# Patient Record
Sex: Female | Born: 1980 | Race: Black or African American | Hispanic: No | Smoking: Never smoker
Health system: Southern US, Community
[De-identification: ages and names within clinical notes are randomized; demographics above are authoritative.]

## PROBLEM LIST (undated history)

## (undated) ENCOUNTER — Inpatient Hospital Stay (HOSPITAL_COMMUNITY): Payer: Self-pay

## (undated) DIAGNOSIS — G971 Other reaction to spinal and lumbar puncture: Secondary | ICD-10-CM

## (undated) DIAGNOSIS — M67439 Ganglion, unspecified wrist: Secondary | ICD-10-CM

## (undated) DIAGNOSIS — Z9889 Other specified postprocedural states: Secondary | ICD-10-CM

## (undated) DIAGNOSIS — B009 Herpesviral infection, unspecified: Secondary | ICD-10-CM

## (undated) DIAGNOSIS — E119 Type 2 diabetes mellitus without complications: Secondary | ICD-10-CM

## (undated) DIAGNOSIS — D649 Anemia, unspecified: Secondary | ICD-10-CM

## (undated) DIAGNOSIS — J302 Other seasonal allergic rhinitis: Secondary | ICD-10-CM

## (undated) DIAGNOSIS — T7840XA Allergy, unspecified, initial encounter: Secondary | ICD-10-CM

## (undated) DIAGNOSIS — R112 Nausea with vomiting, unspecified: Secondary | ICD-10-CM

## (undated) HISTORY — DX: Type 2 diabetes mellitus without complications: E11.9

## (undated) HISTORY — DX: Allergy, unspecified, initial encounter: T78.40XA

## (undated) HISTORY — DX: Anemia, unspecified: D64.9

---

## 2001-02-02 ENCOUNTER — Encounter (INDEPENDENT_AMBULATORY_CARE_PROVIDER_SITE_OTHER): Payer: Self-pay | Admitting: Specialist

## 2001-02-02 ENCOUNTER — Other Ambulatory Visit: Admission: RE | Admit: 2001-02-02 | Discharge: 2001-02-02 | Payer: Self-pay | Admitting: Obstetrics

## 2002-04-29 ENCOUNTER — Emergency Department (HOSPITAL_COMMUNITY): Admission: EM | Admit: 2002-04-29 | Discharge: 2002-04-29 | Payer: Self-pay | Admitting: Emergency Medicine

## 2003-07-24 ENCOUNTER — Emergency Department (HOSPITAL_COMMUNITY): Admission: EM | Admit: 2003-07-24 | Discharge: 2003-07-24 | Payer: Self-pay | Admitting: Emergency Medicine

## 2003-11-17 ENCOUNTER — Other Ambulatory Visit: Admission: RE | Admit: 2003-11-17 | Discharge: 2003-11-17 | Payer: Self-pay | Admitting: Obstetrics and Gynecology

## 2004-05-02 ENCOUNTER — Inpatient Hospital Stay (HOSPITAL_COMMUNITY): Admission: AD | Admit: 2004-05-02 | Discharge: 2004-05-02 | Payer: Self-pay | Admitting: Obstetrics and Gynecology

## 2004-05-05 ENCOUNTER — Inpatient Hospital Stay (HOSPITAL_COMMUNITY): Admission: AD | Admit: 2004-05-05 | Discharge: 2004-05-08 | Payer: Self-pay | Admitting: Obstetrics and Gynecology

## 2005-01-25 ENCOUNTER — Other Ambulatory Visit: Admission: RE | Admit: 2005-01-25 | Discharge: 2005-01-25 | Payer: Self-pay | Admitting: Obstetrics and Gynecology

## 2005-01-31 ENCOUNTER — Ambulatory Visit (HOSPITAL_COMMUNITY): Admission: RE | Admit: 2005-01-31 | Discharge: 2005-01-31 | Payer: Self-pay | Admitting: Obstetrics and Gynecology

## 2005-05-14 ENCOUNTER — Inpatient Hospital Stay (HOSPITAL_COMMUNITY): Admission: AD | Admit: 2005-05-14 | Discharge: 2005-05-17 | Payer: Self-pay | Admitting: Obstetrics and Gynecology

## 2005-05-14 ENCOUNTER — Ambulatory Visit: Payer: Self-pay | Admitting: Family Medicine

## 2005-05-14 ENCOUNTER — Encounter (INDEPENDENT_AMBULATORY_CARE_PROVIDER_SITE_OTHER): Payer: Self-pay | Admitting: Specialist

## 2007-11-19 ENCOUNTER — Inpatient Hospital Stay (HOSPITAL_COMMUNITY): Admission: AD | Admit: 2007-11-19 | Discharge: 2007-11-22 | Payer: Self-pay | Admitting: Obstetrics and Gynecology

## 2010-10-03 ENCOUNTER — Inpatient Hospital Stay: Attending: Maternal & Fetal Medicine

## 2010-10-03 LAB — OFFICE VISIT LAB RESULTS
ABORh: A POS
Free T4: 0.86 ng/dL
Glucose Challenge, 2 Hr Preg: 224 mg/dL
Glucose Challenge, 3 Hr Preg: 206 mg/dL
Glucose, GTT 1HR Preg: 170 mg/dL
Glucose, GTT 1HR Preg: 193 mg/dL
Protein, 24H Urine: <5 mg/24hr
Rubella Antibody: IMMUNE intl units/mL
Varicella IgG: NEGATIVE

## 2010-10-03 NOTE — Unmapped (Signed)
Signed by Leland Johns MD on 10/03/2010 at 00:00:00  OB PRE ADMISSION FORM       Imported By: Milinda Hirschfeld 10/03/2010 10:38:47    _____________________________________________________________________    External Attachment:    Please see [facility] EMR for this document.
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
## 2010-10-03 NOTE — Unmapped (Signed)
Signed by Clinton Quant MS ACNS RNC OB on 10/03/2010 at 16:37:41    Printed Handout:  - 24 Hour Urine Collection Patient Instruction Sheet

## 2010-10-03 NOTE — Unmapped (Signed)
Signed by Clinton Quant MS ACNS RNC OB on 11/02/2010 at 11:05:03        Allergies  ! * PCN    Vital Signs   Weight: 188 lbs.   Pre-Pregnancy Weight: 179 lbs.                 Menses History:   [facility]ospital of Delivery: [facility]  First day LMP: 07/06/2010  This date is: definite  Menses monthly: yes  Frequency:  28 days  Contraception used at conception: None  Planned pregnancy: no  Pregnancy confirmed by: serum HCG  Date confirmed: 10/07/2010  G: 3  P 1:     Term: 1   # Living Children: 1  SAB: 1    Other Pertinent History:   Pre-Pregnacy Tobacco Use per Day: 0  Pregnancy Tobacco Use per Day: 0  Pre-Pregnacy Alcohol Use per Day: 0  Pregnancy Alcohol Use per Day: 0  Pre-Pregnancy Illicit/Recreational Drug Use per Day: 0  Pregnancy Illicit/Recreational Drug Use per Day: 0  D(Rh) Sensitized: No  Latex Allergy: No  Betadine Allergy: No  Anesthetic Complications No  History of Abnormal Pap: No  Uterine Anomaly: No  History of Infertility: No  DES Exposure: No  ART Treatment: No      Infection History:   Exposed to TB: no   History of Genital Herpes: yes - partner has genital herpes  Rash or Viral Illness since LMP: no  Hepatitis B: no  Hepatitis C: no  History of STD: no  History of PID: no  Other Infection History Comments: at risk for HIv having unprotected sex , had trich at the age of 67 but was treated       Genetic Testing/Teratology Counseling:   Patient's Age>= 35 Years as of Estimated Date of Delivery: no  Thalassemia Risk: no  Neural Tube Defect: no  Congenital Heart Defect: no   Down Syndrome: no  Tay-Sachs: no  Canavan Disease: no  Familial Dysautonomia: no  Sickle Cell Disease/Trait: no  Hemophilia/Blood Disorders: no  Muscular Dystrophy: no  Cystic Fibrosis: no  Huntington's Chorea: no  Mental Retardation/Autism no  Tested for Fragile X: no  Other Genetic or Chromosomal: no  Maternal Metabolic Disorder: no  Other birth defects: no  Recurrent Pregnancy Loss/Stillbirth: no  Medications/Drugs/Alcohol  since LMP: no    EDD:   Pregnancy Dating Information:    LMP: 07/06/2010    EDD by LMP: 04/12/2011  1st [place] date: 02/08/2012EDD: 04/12/2011  Today's EGA in weeks: 12 5/7    History of Present Illness:   pt reports failing 3 hr OGTT - 2 values  pt reports nausea 5-7 days  pt reports lifting boxes at work resulting in back pain - no meds  pt reports fatigue      Symptoms:   Complains of: fatigue, nausea    Diabetes History:   Age at Diagnosis: 26  Type: DM Type 2  DM Class: B - Onset at >30 years of age or duration of < 10 years  Other Medications: took Glyburide during pregnancy  took Metformin after delivery of first child  Other History: hx of GDM with last pregnancy 05/06/06  SAB in 03/2008 at 8 wks    Diabetes Treatment:     Glycemic Control:    random fsbg per pt glucose meter after instruction - 107 mg/dL    Compliance:   Dietary Compliance: Fair  Emotional Health: Stable   MNT with Angelique Holm, MS, RDLD/CDE today  10/03/10    Prenatal Exam:     Weight    Pre-Pregnancy Weight: 179 lbs.  Total weight change: 9 lbs.  Weight change since last visit: 188 lbs.    Exam   Est. Gestational Age: 31 5/7    List of Diagnoses/Issues:   T2DM - diet controlled 3-4 years; stopped Metformin after 4 mos d/t low bgs  hx of GDM - took Glyburide  hx of C-Section  estranged from FOB  nausea    Plan of Care:   U/S today 10/03/10   Diabetes self management as prescribed per HCPs  F/U visit with MD 10/09/10; provide bg record and glucose meter  Collect 24 hour urine and have diabetes labs drawn on 10/08/10   Social worker at next visit        Initial Labs:   Blood Type: a positive        Assessment   30 Years Old Female      G: 3  P 1:     Term: 1   # Living Children: 1  SAB: 1  GA: 12 5/7 weeks       EDD: 04/12/2011    New Problems:   DIABETES MELLITUS, TYPE II, CONTROLLED (ICD-250.00)  DIABETES MELLITUS, GESTATIONAL, HX OF (ICD-V13.29)  PREGNANCY, HIGH RISK (ICD-V23.9)  OVERWEIGHT (ICD-278.02)  DIABETES MELLITUS, TYPE II  (ICD-250.00)      Today's Orders   Hemoglobin  A1C   (GLYCO) (496)  [CPT-83036]  24HR Urine Creatinine Clearance (7943) [CPT-82575]  24HR Urine Protein, Total w/Creatinine (757) [CPT-84156,82570]  Comp Metabolic Panel  (METAPNL) (10231) [*CPT-80053]  TSH   (TSH) (899) [CPT-84443]  T4 free  (FT4) (866)  [CPT-84439]  TODAY'S REFERRALS   Nutrition Consult [UCP-11111]    Patient Instructions   Return to office 5 days to turn in 24 hour urine 8AM.  Have labs drawn.  Return to office to see MD for diabetes care.  Bring glucose meter and blood glucose record.  Report blood sugars to Clinton Quant if blood glucoses elevated for 3 or more days at [phone].    Disposition:   Clinic: DAPP  Primary OB Provider: any  Return to clinic for Provider Visit in 6  day(s)   Appointment Reason: evaluate diabetes self management    *Patient Care Summary printed and given to patient.        Spent 60 min with pt counseling about diabetes self management during pregnancy.  Referred to RD for MNT.  Pt agrees to POC and RTO for DAPP visit w/MD.  Will follow as needed. ..................................................................Marland KitchenClinton Quant MS ACNS RNC [facility]  October 03, 2010 4:22 PM          DAPP History & Education   Initial Education    Diabetes History:   Age at Diagnosis: 41  Type: DM Type 2  DM Class: B - Onset at >1 years of age or duration of < 10 years                   Date:   10/08/2010  Free T4:   0.86 ng/dL  24 HR Protein: < 5 mg/dL        Diabetes and Pregnancy Program (DAPP)   Initial Education  GA: 12.5  Learning Assessment  Adequate reading skill: Yes   Refer to dietitian ..................................................................Marland KitchenClinton Quant MS ACNS RNC OB  Oct 03, 2010 4:01 PM  Being Active  Current activity level: usually active (30 mins 5-7 days/week)  Activity goal  set by client: 40 minutes walking/per day back and forth to work   ..................................................................Marland KitchenClinton Quant MS ACNS RNC OB  Oct 03, 2010 4:01 PM    Blood Glucose Monitoring   Verbalizes blood glucose targets for pregnancy   Demonstrates how to record glucose values in daily diary   Washes hands (no alcohol wipes) before fingerstick   Verbalizes safe use and disposal of lancets   Instructed to bring blood sugar record and glucose meter to every OB visit  Check blood glucose:   Fasting;  1 hr postmeal;  Premeal;  At bedtime   Begin monitoring bgs 7 x daily. ..................................................................Marland KitchenClinton Quant MS ACNS RNC OB  Oct 03, 2010 4:01PM    Reducing Risks   Verbalizes the type of diabetes   Verbalizes effects of diabetes on pregnancy   Verbalizes effects of pregnancy on diabetes (Type 1 & 2)   Insulin needs during pregnancy (handout provided)   Verbalizes effects of diabetes during pregnancy on fetus & newborn   Verbalizes the benefits of breastfeeding   Verbalizes understanding of future health risks and prevention   Agrees to perform daily kick counts twice daily (beginning @ 28 weeks)   Understands potential maternal/fetal complications   Understands importance of maternal/fetal testing   Carries glucose meter and fast acting CHO when away from home   ..................................................................Marland KitchenClinton Quant MS ACNS RNC OB  Oct 03, 2010 4:01 PM   ..................................................................Marland KitchenClinton Quant MS ACNS RNC OB  Oct 03, 2010 4:01 PM  Problem-Solving   Verbalizes who to call (resource numbers)   ..................................................................Marland KitchenClinton Quant MS ACNS RNC OB  Oct 03, 2010 4:01 PM

## 2010-10-03 NOTE — Unmapped (Signed)
Signed by Leland Johns MD on 10/03/2010 at 00:00:00  labs 10/01/10      Imported By: Milinda Hirschfeld 10/03/2010 11:00:52    _____________________________________________________________________    External Attachment:    Please see [facility] EMR for this document.

## 2010-10-03 NOTE — Unmapped (Signed)
Signed by Wallis Mart MS RD LD CDE on 10/04/2010 at 12:09:43        DAPP History & Education   Pregnancy Counseling Initial Education    Diabetes History:   DM Class: GDM vs Class B- dx < 20 weeks  Referring Physician: Jodelle Green CNM  Referal Souce: Shella Spearing  Previous Diabetes Education: yes  Where [facility]    Diabetes Education: Learning Assessment  Adequate reading skill: Yes  Barriers to Learning: Financial  Pt became tearful when discussing support system and financial concerns.  Pt was married last pregnancy with diabetes and she is stressed about handling it alone this time.  Emotional support provided and will refer to social worker. ..................................................................Marland KitchenWallis Mart MS RD LD CDE  October 03, 2010 4:38 PM    Pt states she was dx with type 2 DM after last pregnancy in 2009 and was on metformin.  Pt was going too low and she stopped the medication.  Since then pt has not had her BG level checked until now at [redacted] weeks gestation. ..................................................................Marland KitchenWallis Mart MS RD LD CDE  October 03, 2010 4:39 PM    Healthy Eating  My Daily Meal Plan individualized and reviewed with client  States the rationale for meal pattern/snacks  Demonstrates how to count carbs  Demonstrates how to read food labels correctly  Calorie level: 1800   Pt was very receptive to education and remembers a lot of information from previous pregnancy.  Pt very motivated and seems likely to follow program recs.  Pt does not drink regular juice or soda and incorporates very little added sugar into meal plan. Pt recieves WIC and denies eating any non-food substances.  Pts biggest obstacle will be ensuring she eats all her scheduled snacks and meals. Pt tends to not eat if daughter not there due to financial reasons. 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 Instructed pt to speak with HCP about maximum amount she can safely lift. ..................................................................Marland KitchenWallis Mart MS RD LD CDE  October 04, 2010 12:07 PM    Self Monitoring of Blood Glucose (SMBG)   Verbalizes blood glucose targets for pregnancy   Demonstrates how to record glucose values in daily diary   Washes hands (no alcohol wipes) before fingerstick   Verbalizes safe use and disposal of lancets   Instructed to bring blood sugar record and glucose meter to every OB visit   Performs finger stick adequately  Return demo SMBG value= 107  Check blood glucose:   Fasting;  1 hr postmeal;  Premeal;  ACHS Snack  Name of meter: Freestyle  Healthy Coping  Adequate support systems: no  Who is your support system: Sister but about to have own child  Describes stress level as: medium  Referred to Social Services: yes      Time Spent With Patient:   Time spent with patient: 1 hour(s)    PERSONAL MEAL PLAN GOAL   Date: 10/03/2010    Breakfast:   Time: 7-8am  # Carbs: 30    Mid-Morning Snack:   Time: 10am  # Carbs: 15    Lunch:   Time: 12-1pm  # Carbs: 45    Mid-Afternoon Snack:   Time: 3pm  # Carbs: 15  Dinner:   Time: 5-6pm  # Carbs: 45    Evening Snack:   Time: 9-10pm  # Carbs: 30  Comments: High fiber carbs as above with protein source ..................................................................Marland KitchenWallis Mart MS RD LD CDE  October 04, 2010 12:04 PM        Assessment and Plan  New Problems:  Dx of GESTATIONAL DIABETES (ICD-648.80)  Onset: 10/03/2010    Medications   New Prescriptions/Refills:  FREESTYLE FREEDOM LITE W/DEVICE KIT (BLOOD GLUCOSE MONITORING SUPPL) Use as directed  #1 x none, 10/04/2010, Wallis Mart MS RD LD CDE    New medications:  FREESTYLE FREEDOM LITE W/DEVICE KIT -- Use as directed  Start date:  10/03/2010      Today's Orders   G0108 - Diabetes Education Individual (30 mins) [[id number]]

## 2010-10-03 NOTE — Unmapped (Signed)
Signed by   LinkLogic on 10/04/2010 at 11:17:05  Patient: Ashlee Garcia  Note: All result statuses are Final unless otherwise noted.    Tests: (1)  (MR)    Order Note:                                      THE [facility]                                [address]                              [place], [place]  [address]     PATIENT NAME:   Ashlee Garcia, Ashlee Garcia                 VISIT DATE:  10/03/2010  DATE OF BIRTH:  04/28/1981                        MRN:  [id number]  PRIMARY:        Referring Nonstaff                ACCOUNT #:  [id number]  REFERRING:      Referring Nonstaff  DICTATED BY:    Ashlee Garcia, N.P.                                          CLINIC NOTE     *-*-*     Dear Ashlee Garcia, Certified Nurse Midwife.     Thank you for referring your patient to the [facility].  Ashlee Garcia was seen today for an early screening for Down syndrome.  Unfortunately, 80% of undiagnosed chromosome abnormalities are born to women  under the age of 30, though historically we have been told that the risk is  not significant until she reaches this age.  ACOG now recommends that women  have access to diagnostic testing at all ages, but most women do not want to  risk their pregnancy.  Early screening is 95% accurate in detection and does  not have any pregnancy risks.     Ashlee Garcia is a G3, P1.  She was seen at 12-5/7 weeks with an EDD of 04/12/11.  She is 30 years old with a date of birth of 21-Feb-1981.  Her age related risk  for trisomy 57 is 1:593.  Her age related risk for trisomy 49 is 1:1641 and  her age related risk for trisomy 52 is 1:5168.  I reviewed her pedigree tree  and her past medical history with no additional risks noted.     Your patient was informed that the nuchal translucency is a screening tool  and not diagnostic.  The only way to know 100% if her baby has any  chromosomal abnormalities is with an amniocentesis.  The  nuchal translucency  does not equate abnormalities, but only prompts [place] to complete further  testing or for closer surveillance of any complications that may occur later  in pregnancy such as preterm labor, IUGR,  IUFD or placental insufficiency.  We do utilize contingent screening and depending on the results of the  testing, we will adjust the plan of care for individualized care of your  patient.  This screening minimizes amniocentesis.  All of this screening was  explained to the patient.  All questions were answered.  Recommendations are  discussed with all patients after the results are received.  Analytes were  obtained and ultrasound completed at today's visit.  All follow-up care and  further orders do need to be completed by yourself, the primary provider.     More than 50% of this 15 minute consult time was spent face to face.     Thank you for allowing [place] to care for your patient.        *-*-*                                              _______________________________________  EG/nc                                  _____  D:  10/03/2010 11:53                   Ashlee Garcia, N.P.  T:  10/04/2010 11:14  Job #:  [id number]     c:   Ashlee Garcia                                       CLINIC NOTE                                                               PAGE    1 of   1    Note: An exclamation mark (!) indicates a result that was not dispersed into   the flowsheet.  Document Creation Date: 10/04/2010 11:17 AM  _______________________________________________________________________    (1) Order result status: Final  Collection or observation date-time: 10/03/2010 00:00  Requested date-time:   Receipt date-time:   Reported date-time:   Referring Physician: Referring Nonstaff  Ordering Physician:  Reviewed [facility] [place] [facility])  Specimen Source:   Source: DBS  Filler Order Number: [id number]  Lab site:

## 2010-10-03 NOTE — Unmapped (Signed)
Signed by Ranae Palms NP on 10/03/2010 at 11:35:09    NT completed. Analytes received/sent and ultrasound completed. Referring provider is S. Deimling at [facility]. Best number to call results to is: [phone]. See dictation.  ..................................................................Marland KitchenRanae Palms NP  October 03, 2010 11:33 AM     Assessment   30 Years Old Female      G: 3   # Living Children: 1  SAB: 1  New Problems:   PREGNANCY, MULTIGRAVIDA (ICD-V22.1)

## 2010-10-03 NOTE — Unmapped (Signed)
Signed by Leland Johns MD on 10/03/2010 at 00:00:00  PROGRESS NOTES      Imported By: Milinda Hirschfeld 10/03/2010 10:41:16    _____________________________________________________________________    External Attachment:    Please see [employer] EMR for this document.

## 2010-10-03 NOTE — Unmapped (Signed)
Signed by Leland Johns MD on 10/03/2010 at 00:00:00  PROGRESS NOTES      Imported By: Milinda Hirschfeld 10/03/2010 10:40:50    _____________________________________________________________________    External Attachment:    Please see [employer] EMR for this document.

## 2010-10-04 NOTE — Unmapped (Signed)
THE [facility]                                 [address]                               [place], [place]  [address]     PATIENT NAME:   Ashlee Garcia, Ashlee Garcia                 VISIT DATE:  10/03/2010   DATE OF BIRTH:  11/07/1980                        MRN:  [id number]   PRIMARY:        Referring Nonstaff                ACCOUNT #:  [id number]   REFERRING:      Referring Nonstaff   DICTATED BY:    Tacey Ruiz, N.P.                                         CLINIC NOTE     *-*-*     Dear Epifania Gore, Certified Nurse Midwife.     Thank you for referring your patient to the [facility].  Tynlee Bayle was seen today for an early screening for Down syndrome.   Unfortunately, 80% of undiagnosed chromosome abnormalities are born to women   under the age of 52, though historically we have been told that the risk is   not significant until she reaches this age.  ACOG now recommends that women   have access to diagnostic testing at all ages, but most women do not want to   risk their pregnancy.  Early screening is 95% accurate in detection and does   not have any pregnancy risks.     [name] is a G3, P1.  She was seen at 12-5/7 weeks with an EDD of 04/12/11.   She is 30 years old with a date of birth of 07/12/1981.  Her age related risk   for trisomy 36 is 1:593.  Her age related risk for trisomy 81 is 1:1641 and   her age related risk for trisomy 36 is 1:5168.  I reviewed her pedigree tree   and her past medical history with no additional risks noted.     Your patient was informed that the nuchal translucency is a screening tool   and not diagnostic.  The only way to know 100% if her baby has any   chromosomal abnormalities is with an amniocentesis.  The nuchal translucency   does not equate abnormalities, but only prompts [place] to complete further   testing or for closer surveillance of any complications that may  occur later   in pregnancy such as preterm labor, IUGR, IUFD or placental insufficiency.   We do utilize contingent screening and depending on the results of the   testing, we will adjust the plan of care for individualized care of your   patient.  This screening minimizes amniocentesis.  All of this screening was   explained  to the patient.  All questions were answered.  Recommendations are   discussed with all patients after the results are received.  Analytes were   obtained and ultrasound completed at today's visit.  All follow-up care and   further orders do need to be completed by yourself, the primary provider.     More than 50% of this 15 minute consult time was spent face to face.     Thank you for allowing [place] to care for your patient.       *-*-*                                             _______________________________________   EG/nc                                  _____   D:  10/03/2010 11:53                   Tacey Ruiz, N.P.   T:  10/04/2010 11:14   Job #:  [id number]     c:   Epifania Gore                                       CLINIC NOTE                                                                PAGE    1 of   1   T:  10/04/2010 11:14   Job #:  [id number]     c:   Epifania Gore                                       CLINIC NOTE                                                                PAGE    1 of   1

## 2010-10-04 NOTE — Unmapped (Signed)
Signed by Wallis Mart MS RD LD CDE on 10/11/2010 at 09:53:01    SOCIAL WORKER REFERRAL      - Pt needs assitance with baby supplies, resources, emotional support as she is divorced and trying to work with high risk pregnancy.  She has a daughter who lives with her 4 days per week.

## 2010-10-08 ENCOUNTER — Inpatient Hospital Stay

## 2010-10-08 LAB — COMPREHENSIVE METABOLIC PANEL
ALT: 19 units/L (ref 6–40)
AST: 22 units/L (ref 14–36)
Albumin: 3.8 g/dL (ref 3.6–5.1)
Alkaline Phosphatase: 44 units/L (ref 33–115)
Anion Gap: 8 (ref 3–16)
BUN: 7 mg/dL (ref 7–25)
CO2: 21 mmol/L (ref 21–33)
Calcium: 9 mg/dL (ref 8.6–10.2)
Chloride: 109 mmol/L (ref 98–110)
Creatinine: 0.52 mg/dL (ref 0.50–1.20)
GFR MDRD Non Af Amer: 139 mL/min
Potassium: 3.7 mmol/L (ref 3.5–5.3)
Sodium: 138 mmol/L (ref 135–146)
Total Bilirubin: 0.2 mg/dL (ref 0.2–1.2)
Total Protein: 6.9 g/dL (ref 6.2–8.3)

## 2010-10-08 LAB — PROTEIN, URINE, 24 HOUR
Creatinine, 24 Hour Urine: 1.36 g/(24.h) (ref 0.80–1.80)
Creatinine, Random Urine: 115.6 mg/dL

## 2010-10-08 LAB — CREATININE CLEARANCE (24 HOUR URINE)
Creatinine Clearance: 165 mL/min (ref 88–128)
Creatinine, 24 Hour Urine: 1281.9 mg/24hr (ref 800.0–1800.0)
Creatinine, Random Urine: 109.1 mg/dL (ref 16.0–327.0)
Creatinine: 0.54 mg/dL (ref 0.57–1.00)
EGFR: 147 mL/min/{1.73_m2} (ref >59)

## 2010-10-08 LAB — TSH: TSH: 1.35 microintl units/mL (ref 0.45–4.50)

## 2010-10-08 LAB — CREATININE, URINE, 24 HOUR
Creatinine, 24 Hour Urine: 1.36 g/(24.h) (ref 0.80–1.80)
Creatinine, Random Urine: 115.6 mg/dL

## 2010-10-08 LAB — HEMOGLOBIN A1C: Hemoglobin A1C: 5.6 % (ref 4.8–6.4)

## 2010-10-08 NOTE — Unmapped (Signed)
Signed by Clinton Quant MS ACNS RNC OB on 10/08/2010 at 15:28:52  Patient: Ashlee Garcia  Note: All result statuses are Final unless otherwise noted.    Tests: (1) TIMED URINE PROTEIN (24TP)    Creatinine, Ur            115.6 mg/dL  ! T. Protein, Ur            <5 mg/dL   Creatinine, 24 Hour Urine                              1.36 g/24 hr                0.80-1.80  ! Volume (ml)               1175 mL   Protein, 24 hour calc.                              See Note mg/24 hr           30.0-150.0      Unable to calculate result either because contributing result(s) are   outside of reportable range or are not available.    Note: An exclamation mark (!) indicates a result that was not dispersed into   the flowsheet.  Document Creation Date: 10/08/2010 9:42 AM  _______________________________________________________________________    (1) Order result status: Final  Collection or observation date-time: 10/08/2010 08:25  Requested date-time: 10/08/2010 08:25  Receipt date-time: 10/08/2010 08:51  Reported date-time: 10/08/2010 09:42  Referring Physician: Cindi Carbon NONSTAFF  Ordering Physician: Clinton Quant Hartley Barefoot)  Specimen Source: UD&TIMED URINE     HLA RMTMP&YEL PLAIN (Rm Tmp)  Source: Faith Rogue Order Number: [id number]  Lab site: [facility]      [address]      [place] [address]  [phone]      -----------------    The following non-numeric lab results were dispersed to  the flowsheet even though numeric results were expected:      Protein, 24 hour calc., See Note

## 2010-10-08 NOTE — Unmapped (Signed)
Signed by Clinton Quant MS ACNS RNC OB on 10/08/2010 at 15:28:52  Patient: Ashlee Garcia  Note: All result statuses are Final unless otherwise noted.    Tests: (1) FREE T4 (FT4)  ! Free T4                   0.86 ng/dL                  0.61-1.76    Note: An exclamation mark (!) indicates a result that was not dispersed into   the flowsheet.  Document Creation Date: 10/08/2010 9:50 AM  _______________________________________________________________________    (1) Order result status: Final  Collection or observation date-time: 10/08/2010 08:25  Requested date-time: 10/08/2010 08:25  Receipt date-time: 10/08/2010 08:56  Reported date-time: 10/08/2010 09:50  Referring Physician: Cindi Carbon NONSTAFF  Ordering Physician: Clinton Quant Hartley Barefoot)  Specimen Source: S&SERUM     SST ROOM T&SST (serum Rm Tmp)  Source: Faith Rogue Order Number: [id number]  Lab site: [facility]      [address]      [place] [address]  [phone]

## 2010-10-08 NOTE — Unmapped (Signed)
Signed by Clinton Quant MS ACNS RNC OB on 10/08/2010 at 15:29:22  Patient: Ashlee Garcia  Note: All result statuses are Final unless otherwise noted.    Tests: (1) THYROID STIMULATING HORMONE (TSH)    TSH, 3rd Generation       1.35 mIU/L                  0.45-4.50    Note: An exclamation mark (!) indicates a result that was not dispersed into   the flowsheet.  Document Creation Date: 10/08/2010 10:00 AM  _______________________________________________________________________    (1) Order result status: Final  Collection or observation date-time: 10/08/2010 08:25  Requested date-time: 10/08/2010 08:25  Receipt date-time: 10/08/2010 08:56  Reported date-time: 10/08/2010 10:00  Referring Physician: Cindi Carbon NONSTAFF  Ordering Physician: Clinton Quant Hartley Barefoot)  Specimen Source: S&SERUM     SST ROOM T&SST (serum Rm Tmp)  Source: Faith Rogue Order Number: [id number]  Lab site: [facility]      [address]      [place] [address]  [phone]

## 2010-10-08 NOTE — Unmapped (Signed)
Signed by Clinton Quant MS ACNS RNC OB on 10/08/2010 at 15:28:52  Patient: Ashlee Garcia  Note: All result statuses are Final unless otherwise noted.    Tests: (1) GLYCOHEMOGLOBIN, A1C (GLYCO)    Hemoglob A1C              5.6 %                       4.8-6.4    Note: An exclamation mark (!) indicates a result that was not dispersed into   the flowsheet.  Document Creation Date: 10/08/2010 9:38 AM  _______________________________________________________________________    (1) Order result status: Final  Collection or observation date-time: 10/08/2010 08:25  Requested date-time: 10/08/2010 08:25  Receipt date-time: 10/08/2010 08:56  Reported date-time: 10/08/2010 09:38  Referring Physician: Cindi Carbon NONSTAFF  Ordering Physician: Clinton Quant Hartley Barefoot)  Specimen Source: WB&WHOLE BLOOD     LAVRFG&LAV (WB Refrig)  Source: Faith Rogue Order Number: [id number]  Lab site: [facility]      [address]      [place] [address]  [phone]

## 2010-10-08 NOTE — Unmapped (Signed)
Signed by Clinton Quant MS ACNS RNC OB on 10/09/2010 at 13:34:54  Patient: Ashlee Garcia  Note: All result statuses are Final unless otherwise noted.    Tests: (1) CREATININE CLEARANCE (CREATCL)    Order Note: PERFORMED AT: CB    Order Note: [employer] [facility]    Order Note: [address]    Order Note: [place], [place] [id number]    Order Note: LAB DIRECTOR: Jacki Cones, MD   PHONE: [phone]    Creatinine, Ur            109.1 mg/dL                 69.6-295.2   Creatinine, 24 Hour Urine                              1281.9 mg/24 hr             800.0-1800.0    CREATININE, SERUM    [L]  0.54 mg/dL                  0.57-1.00    Creatinine Clearance [H]  165 mL/min                  88-128      The above range is based on 1.73 square meter average body      surface area.    GFR Estimated             128 mL/min/1.73             >59    GFR MDRD Af Amer          147 mL/min/1.73             >59      Note: A persistent eGFR <60 mL/min/1.73 m2 (3 months or      more) may indicate chronic kidney disease. An eGFR >59      mL/min/1.73 m2 with an elevated urine protein also may      indicate chronic kidney disease. Calculated using CKD-EPI      formula.    Note: An exclamation mark (!) indicates a result that was not dispersed into   the flowsheet.  Document Creation Date: 10/09/2010 5:11 AM  _______________________________________________________________________    (1) Order result status: Final  Collection or observation date-time: 10/08/2010 08:17  Requested date-time: 10/08/2010 08:17  Receipt date-time: 10/08/2010 18:46  Reported date-time: 10/09/2010 05:11  Referring Physician: Cindi Carbon NONSTAFF  Ordering Physician: Clinton Quant Hartley Barefoot)  Specimen Source: UD&TIMED URINE     CREATCL&U(109ml/24hr)+1ml S RM TMP  Source: Faith Rogue Order Number: [id number] LA01  Lab site: [facility]      [address]      [place] [address]  [phone]      -----------------    The following results differed from  their previous value:      GFR MDRD Af Amer   Old Value: 128 New Value: 147

## 2010-10-08 NOTE — Unmapped (Signed)
Signed by Clinton Quant MS ACNS RNC OB on 10/08/2010 at 15:28:52  Patient: Ashlee Garcia  Note: All result statuses are Final unless otherwise noted.    Tests: (1) TIMED URINE CREATININE (24CREAT)    Order Note: VOL  1175    Creatinine, Ur            115.6 mg/dL   Creatinine, 24 Hour Urine                              1.36 g/24 hr                0.80-1.80  ! Volume (ml)               1175 mL    Note: An exclamation mark (!) indicates a result that was not dispersed into   the flowsheet.  Document Creation Date: 10/08/2010 9:51 AM  _______________________________________________________________________    (1) Order result status: Final  Collection or observation date-time: 10/08/2010 08:25  Requested date-time: 10/08/2010 08:25  Receipt date-time: 10/08/2010 08:51  Reported date-time: 10/08/2010 09:51  Referring Physician: Cindi Carbon NONSTAFF  Ordering Physician: Clinton Quant Hartley Barefoot)  Specimen Source: UD&TIMED URINE     HLA RMTMP&YEL PLAIN (Rm Tmp)  Source: Faith Rogue Order Number: [id number] LA01  Lab site: [facility]      [address]      [place] [address]  [phone]

## 2010-10-08 NOTE — Unmapped (Signed)
Signed by Clinton Quant MS ACNS RNC OB on 10/08/2010 at 15:28:22  Patient: Ashlee Garcia  Note: All result statuses are Final unless otherwise noted.    Tests: (1) COMPREHENSIVE METABOLIC PANEL (METAPNL)    Sodium                    138 mmol/L                  135-146    Potassium                 3.7 mmol/L                  3.5-5.3    Chloride                  109 mmol/L                  98-110    CO2                       21 mmol/L                   21-33    Anion Gap                 8 mmol/L                    3-16    BUN                       7 mg/dL                     0-98    Creatinine                0.52 mg/dL                  0.50-1.20  ! Glucose                   90 mg/dL                    11-91    Calcium                   9.0 mg/dL                   4.7-82.9    BILI, Total               0.2 mg/dL                   5.6-2.1    AST (SGOT)                22 U/L                      14-36    ALT (SGPT)                19 U/L                      6-40    Alk Phosphatase           44 U/L                      33-115    Protein, Total  6.9 g/dL                    2.5-9.5    Albumin                   3.8 g/dL                    6.3-8.7    GFR MDRD Af Amer          169 See note.      GFR is estimated using creatinine, age, gender and race. Patient's values   should be interpreted as a trend.      Below 90 mL/min/1.70m2, the patient may have renal disease.       For additional information:   www.kidney.org      GFR MDRD Non Af Amer      139 See note.      GFR is estimated using creatinine, age, gender and race. Patient's values   should be interpreted as a trend.      Below 90 mL/min/1.35m2, the patient may have renal disease.       For additional information:   www.kidney.org    ! 1/Creatinine              1.92 ratio    Note: An exclamation mark (!) indicates a result that was not dispersed into   the flowsheet.  Document Creation Date: 10/08/2010 9:27  AM  _______________________________________________________________________    (1) Order result status: Final  Collection or observation date-time: 10/08/2010 08:25  Requested date-time: 10/08/2010 08:25  Receipt date-time: 10/08/2010 08:56  Reported date-time: 10/08/2010 09:27  Referring Physician: Cindi Carbon NONSTAFF  Ordering Physician: Clinton Quant Hartley Barefoot)  Specimen Source: P&PLASMA     GRN45&LT GRN PST LITH HEP (4.5 mL)  Source: Faith Rogue Order Number: [id number]  Lab site: [facility]      [address]      [place] [address]  [phone]      -----------------    The following results differed from their previous value:      GFR MDRD Non Af Amer   Old Value: 169 New Value: 139

## 2010-10-09 NOTE — Unmapped (Signed)
Signed by Debbe Bales BSN, CDE on 10/09/2010 at 17:27:20    PHONE NOTE    PHONE NOTE  Call placed to: [facility]  Details for Reason: Pt was unable to fill Rx for SMBG supplies.  Action Taken: phone call completed  Details of Action Plan: CAlled and spoke with Para March at [facility].  She approved over ride for SMBG supplies (strips and lancets) for 7x/day testing.   Pt informed that she should be able to fill Rx this evening.  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

## 2010-10-09 NOTE — Unmapped (Signed)
Signed by Parks Neptune MD on 10/10/2010 at 16:05:34  Patient: Ashlee Garcia  Note: All result statuses are Final unless otherwise noted.    Tests: (1) POC GLU MONITORING DEVICE (POCGMD)  ! POC GLU MONITORING DEVICE                         [H]  100 mg/dL                   71-06    Note: An exclamation mark (!) indicates a result that was not dispersed into   the flowsheet.  Document Creation Date: 10/09/2010 6:19 PM  _______________________________________________________________________    (1) Order result status: Final  Collection or observation date-time: 10/09/2010 15:45  Requested date-time: 10/09/2010 15:45  Receipt date-time: 10/09/2010 18:19  Reported date-time: 10/09/2010 18:19  Referring Physician: Cindi Carbon NONSTAFF  Ordering Physician: REFERRING(UOP) NONSTAFF [facility])  Specimen Source: B&BLOOD     CAP&Capillary Collection  Source: Faith Rogue Order Number: [id number] LA01  Lab site: [facility]      [address]      [place] [address]  [phone]

## 2010-10-09 NOTE — Unmapped (Signed)
Signed by Ovidio Hanger MD on 11/05/2010 at 23:52:53  FIRST TRIMESTER SCREEN LAB RPT FR: [email or web address] (DATED 10/03/10)      Imported By: Georgianne Fick 11/01/2010 19:23:34    _____________________________________________________________________    External Attachment:    Please see [employer] EMR for this document.
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
## 2010-10-09 NOTE — Unmapped (Signed)
Signed by Parks Neptune MD on 10/20/2010 at 03:40:42      Reason for Visit   Chief Complaint: Return OB Visit BS 100  History from: patient    Allergies  ! * PCN    Medications  FREESTYLE FREEDOM LITE W/DEVICE KIT (BLOOD GLUCOSE MONITORING SUPPL) Use as directed        Vital Signs   Height: 64 in.   Weight: 185.6 lbs.     Wt change since last visit: 185.60 lbs      Total Preg Wt Change: 185.60 lbs   BMI (in-lb): 31.97   BSA (m2): 1.90  Pulse rate: 80   Temperature: 98.8 degrees  F   Blood Pressure   BP #1: 123 / 56mm Hg  Cuff Size: Std     Pain:     Have you had pain other than everyday aches and pains (e.g., mild headache, back ache, strains) in the past week?   No    Intake recorded by: Collier Salina MA on October 09, 2010 3:46 PM                 Past Pregnancies:  G:  3 P:   A:  1  SAB: 1  EAB:    Term Deliveries:  1  Premature Deliveries:   Ectopic pregnancies:    H/O Multi-fetal Delivery:      Preg # Date Early Loss/ Termination GA Wks Lgth of Labor Brth Wt Sex Type of Del Anes Pl of Del PTL Fetal Comp Matrnl Comp Cmnts   1 04/2006  40  8 lbs 15 z F c-section -low transverse epidural [employer]  fail to progress     2 03/2008 SAB 8 weeks             3 current.               4                  RN intake: 30 y.o. G3P1 at 13.[redacted] weeks gestation  Type 2 vs GDMA vs normal values  RBS 108  gestational age too early for fetal movement  anatomy scan 3/27, 2012 0900  pt needs test strips.  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 She had this in a previous pregnancy. was given wrist splints at that time.  no hospitalizations  ...................................................Marland KitchenDebra A. Sabas Sous October 09, 2010 1612    Menses History:   [facility]ospital [facility] Delivery[facility]  First day LMP: 07/06/2010  This date is: definite  Menses monthly: yes  Frequency:  28 days  Contraception used at conception: None  Planned pregnancy: no  Pregnancy confirmed by: serum HCG  Date  confirmed: 10/07/2010  G: 3   Term: 1   Preterm: 0   Ab: 1   # Living Children: 1  SAB: 1  Past History  Surgical History (reviewed - no changes required):  Cesarean Section: 2007, Problems with anesthesia, nausea and vomiting, adnoids removed as child     Social History (reviewed - no changes required): Preferred Language: English,   Marital Status: single,   Children: 1,   Employment Status: employed full-time,   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,   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  Patient Lives at: apartment,   Support System: excellent  Caffeine per Day: 2  Alcohol Use: none  Drug Use: none  Tobacco Usage:smoker  Cigarettes-Packs per Day- 0.5,   Sexually Active: Yes  Total #  of Sex Partners: 1      Other Pertinent History:   Pre-Pregnacy Tobacco Use per Day: 0  Pregnancy Tobacco Use per Day: 0  Pre-Pregnacy Alcohol Use per Day: 0  Pregnancy Alcohol Use per Day: 0  Pre-Pregnancy Illicit/Recreational Drug Use per Day: 0  Pregnancy Illicit/Recreational Drug Use per Day: 0  D(Rh) Sensitized: No  Latex Allergy: No  Betadine Allergy: No  Anesthetic Complications No  History of Abnormal Pap: No  Uterine Anomaly: No  History of Infertility: No  DES Exposure: No  ART Treatment: No      Infection History:   Exposed to TB: no   History of Genital Herpes: yes - partner has genital herpes  Rash or Viral Illness since LMP: no  Hepatitis B: no  Hepatitis C: no  History of STD: no  History of PID: no  Other Infection History Comments: at risk for HIv having unprotected sex , had trich at the age of 89 but was treated       Genetic Testing/Teratology Counseling:   Patient's Age>= 35 Years as of Estimated Date of Delivery: no  Thalassemia Risk: no  Neural Tube Defect: no  Congenital Heart Defect: no   Down Syndrome: no  Tay-Sachs: no  Canavan Disease: no  Familial Dysautonomia: no  Sickle Cell Disease/Trait: no  Hemophilia/Blood Disorders: no  Muscular Dystrophy: no  Cystic Fibrosis: no  Huntington's Chorea: no  Mental  Retardation/Autism no  Tested for Fragile X: no  Other Genetic or Chromosomal: no  Maternal Metabolic Disorder: no  Other birth defects: no  Recurrent Pregnancy Loss/Stillbirth: no  Medications/Drugs/Alcohol since LMP: no    EDD:   Pregnancy Dating Information:    LMP: 07/06/2010    EDD by LMP: 04/12/2011  1st [place] date: 10/03/2010 = 12  weeks, 5  days  EDD by 1st [place]: 04/12/2011  EDD: 04/12/2011 by Junius Creamer    Today's EGA in weeks: 13 4/7    History of Present Illness:   30yo G3P1011 @ 13+4wk by 12wk Korea for ROB visit. Pt today without any problems. Denies VB, cramping. Pt with concerns for her personal safety regarding the FOB. Pt has seen SW today and has been crisis help line numbers.     Symptoms:   Denies: back pain, change in discharge, constipation, fatigue, headache, heartburn, vomiting, pelvic pressure, preterm labor symptoms, swelling, uterine activity, vaginal bleeding, loss of fluid  Fetal movement: not yet noted    Diabetes History:   DM Class: GDM vs Class B- dx < 20 weeks  Diabetes Medications Prior to Pregnancy: Glyburide, Metformin  Other Medications: Hx of taking Glyburide with last pregnancy and metformin afterwards, but was ultimately stopped.     Diabetes Treatment:     Glycemic Control:   %BG Values Outside Targets: Excellent (<5%)  Hyperglycemia (BGs>200 mg/dl) Total Number: 0  Hypoglycemia (<50 mg/dl) Management: 0    Prenatal Exam:     Weight      Exam   Est. Gestational Age: 72 4/7  Fundal height (cm): not palpable  Presentation: --  FHR: --    List of Diagnoses/Issues:   30yo G3P1011  Pt with concerns for her personal safety regarding the FOB        - S/p SW today (2/14) and has been crisis help line numbers.   Class B DM:        -Hx of GDM - took Glyburide, took Metformin PP       -TSH, free T4 wnl.  24hr urine 5mg . HgA1c 5.6.        -Failed OGTT 2abnormal values       -Currently on no meds. Pt was to record FBGB however has not yet done so secondary to no having testing supplies yet.   Hx of  C-Section    Fetal Well  Being:   Start antenatal fetal surveillance at 32 weeks.     Plan of Care:   -Advanced approval for test supplies given today.   -RTC 1wk with SMBG logs to determine whether or not patient needs medication at this time.   -Cont diabetic diet.   -S/p SW visit today 10/09/10. S/W as needed at future visits.         LABS REVIEWED    Initial Labs:   Blood Type: a positive        Assessment   30 Years Old Female      G: 3   Term: 1   Preterm: 0   Ab: 1   # Living Children: 1  SAB: 1  GA: 13 4/7 weeks       EDD: 04/12/2011    Today's Orders   OB US, Comprehensive Anatomy (First US done at greater than 18 weeks) [[id number]]    Disposition:   Return to clinic for Provider Visit in 1 week(s)   Appointment Reason: ROB    *Patient Care Summary printed and given to patient.      Nature conservation officer   I discussed with resident and agree with resident's findings and plan as documented in resident's note.    HPI: 13 4/[redacted] weeks gestation.  GDM with early failed 3hr OGTT.  Has not yet started daily glycemic profiling due to issues with obtaining supplies.  Hx of GDM with previous pregnancy.  Multiple social issues.  Physical Exam: As recorded.  A&P Instructed for glycemic profile monitoring.    RTC 1 weeks with values for assessment.  SW consult and support.    Preceptor: Leland Johns MD on October 16, 2010 1:20 PM          DAPP History & Education     Diabetes History:   DM Class: GDM vs Class B- dx < 20 weeks  Referring Physician: Jodelle Green CNM

## 2010-10-12 NOTE — Unmapped (Signed)
Signed by Leland Johns MD on 10/12/2010 at 00:00:00  LABS      Imported By: Milinda Hirschfeld 10/12/2010 10:13:36    _____________________________________________________________________    External Attachment:    Please see [facility] EMR for this document.

## 2010-10-17 NOTE — Unmapped (Signed)
Signed by Leland Johns MD on 10/17/2010 at 00:00:00  BLOOD SUGARS      Imported By: Milinda Hirschfeld 10/17/2010 17:51:36    _____________________________________________________________________    External Attachment:    Please see [facility] EMR for this document.

## 2010-10-25 NOTE — Unmapped (Signed)
Signed by Leland Johns MD on 10/25/2010 at 00:00:00  DIETARY RECALL       Imported By: Milinda Hirschfeld 10/26/2010 10:29:38    _____________________________________________________________________    External Attachment:    Please see [facility] EMR for this document.

## 2010-10-30 LAB — POC URINALYSIS
Glucose, UA: NEGATIVE g/dL
Ketones, UA: NEGATIVE
Nitrite, UA: NEGATIVE
Protein, UA: 30
RBC, UA: NEGATIVE
Specific Gravity, UA: >=1.03 (ref 1.005–1.035)
Urobilinogen, UA: 0.2 E units/dL (ref 0.2–1.0)
pH, UA: 5.5 (ref 5.0–8.0)

## 2010-10-30 NOTE — Unmapped (Signed)
Signed by Ovidio Hanger MD on 11/05/2010 at 23:52:53  Patient: Ashlee Garcia  Note: All result statuses are Final unless otherwise noted.    Tests: (1) POC GLU MONITORING DEVICE (POCGMD)  ! POC GLU MONITORING DEVICE                              83 mg/dL                    95-63    Note: An exclamation mark (!) indicates a result that was not dispersed into   the flowsheet.  Document Creation Date: 10/30/2010 5:23 PM  _______________________________________________________________________    (1) Order result status: Final  Collection or observation date-time: 10/30/2010 16:17  Requested date-time: 10/30/2010 16:17  Receipt date-time: 10/30/2010 17:22  Reported date-time: 10/30/2010 17:23  Referring Physician: Cindi Carbon NONSTAFF  Ordering Physician: REFERRING(UOP) NONSTAFF [facility])  Specimen Source: B&BLOOD     CAP&Capillary Collection  Source: Faith Rogue Order Number: [id number]  Lab site: [facility]      [address]      [place] [address]  [phone]

## 2010-10-30 NOTE — Unmapped (Signed)
Signed by Georgina Quint RN, BSN on 10/30/2010 at 17:04:12          October 30, 2010      Ashlee Garcia  [address]  [place], [place]  [address]    WORK EXCUSE    This patient was seen in the office October 30, 2010  Please excuse from work for the afternoon.  She has an appointment on November 06, 2010      Respectfully,        Debra A. Wyn Quaker RN,BSN  Perinatal Nurse Clinician

## 2010-10-30 NOTE — Unmapped (Signed)
Signed by Ovidio Hanger MD on 11/06/2010 at 00:24:01      Reason for Visit   Chief Complaint: Return OB Visit  History from: patient    Allergies  ! * PCN    Vital Signs   Height: 64 in.   Weight: 182.1 lbs.     BMI (in-lb): 31.37   BSA (m2): 1.88  Pulse rate: 78   Temperature: 97.4 degrees  F   Blood Pressure   BP #1: 130 / 61mm Hg  Cuff Size: Std   Intake recorded by: Tressie Ellis MA on October 30, 2010 4:14 PM             RN intake: 30 y.o. G3P1 at 16.[redacted] weeks gestation  GDMA1 vs Type 2 no meds at this time  RBS 25  Gestational age too early for [facility]  Anatomy scan March 27  pt concerned about weight loss  pt states she has adequate supplies to manage her diabetes  no hospitalizations  reports Upper respiratory symptoms, vitamins causing nausea and constipation, can it be changed.  ............................................................Marland KitchenDebra A. Wyn Quaker, RN,BSN October 30, 2010 1627      Menses History:   [facility] Delivery[facility]  First day LMP: 07/06/2010  This date is: definite  Menses monthly: yes  Frequency:  28 days  Contraception used at conception: None  Planned pregnancy: no  Pregnancy confirmed by: serum HCG  Date confirmed: 10/07/2010  G: 3   Term: 1   Preterm: 0   Ab: 1   # Living Children: 1  SAB: 1    Other Pertinent History:   Pre-Pregnacy Tobacco Use per Day: 0  Pregnancy Tobacco Use per Day: 0  Pre-Pregnacy Alcohol Use per Day: 0  Pregnancy Alcohol Use per Day: 0  Pre-Pregnancy Illicit/Recreational Drug Use per Day: 0  Pregnancy Illicit/Recreational Drug Use per Day: 0  D(Rh) Sensitized: No  Latex Allergy: No  Betadine Allergy: No  Anesthetic Complications No  History of Abnormal Pap: No  Uterine Anomaly: No  History of Infertility: No  DES Exposure: No  ART Treatment: No      Infection History:   Exposed to TB: no   History of Genital Herpes: yes - partner has genital herpes  Rash or Viral Illness since LMP: no  Hepatitis B: no  Hepatitis C: no  History of STD: no  History of  PID: no  Other Infection History Comments: at risk for HIv having unprotected sex , had trich at the age of 46 but was treated       Genetic Testing/Teratology Counseling:   Patient's Age>= 35 Years as of Estimated Date of Delivery: no  Thalassemia Risk: no  Neural Tube Defect: no  Congenital Heart Defect: no   Down Syndrome: no  Tay-Sachs: no  Canavan Disease: no  Familial Dysautonomia: no  Sickle Cell Disease/Trait: no  Hemophilia/Blood Disorders: no  Muscular Dystrophy: no  Cystic Fibrosis: no  Huntington's Chorea: no  Mental Retardation/Autism no  Tested for Fragile X: no  Other Genetic or Chromosomal: no  Maternal Metabolic Disorder: no  Other birth defects: no  Recurrent Pregnancy Loss/Stillbirth: no  Medications/Drugs/Alcohol since LMP: no    EDD:   LMP: 07/06/2010    EDD by LMP: 04/12/2011  1st [place] date: 10/03/2010 = 12  weeks, 5  days  EDD by 1st [place]: 04/12/2011  EDD: 04/12/2011 by Junius Creamer    Today's EGA in weeks: 16 4/7    History of Present Illness:  Pt seen and examined. Pt complains of URI symptoms no fevers, no other complaints    Symptoms:   Complains of: nausea  Denies: headache, heartburn, vomiting, vaginal bleeding, loss of fluid  Fetal movement: present - not adequate    Diabetes History:   DM Class: B - Onset at >44 years of age or duration of < 10 years  Diabetes Medications Prior to Pregnancy: Glyburide, Metformin  Other Medications: Hx of taking Glyburide with last pregnancy and metformin afterwards, but was ultimately stopped.     Diabetes Treatment:     Glycemic Control:   %BG Values Outside Targets: Excellent (<5%)  Hyperglycemia (BGs>200 mg/dl) Total Number: 0  Hypoglycemia (<50 mg/dl) Management: NA    Compliance:   Dietary Compliance: Excellent  Physical Activity: Other  Emotional Health: Stable    Prenatal Exam:     Weight    Weight change since last visit: -3.50 lbs.    Exam   Est. Gestational Age: 32 4/7  FHR: 140's    List of Diagnoses/Issues:   29yo G3P1011  Pt with concerns for her  personal safety regarding the FOB        - S/p SW  (2/14) and has been crisis help line numbers.   Class B DM:        -Hx of GDM - took Glyburide, took Metformin PP       -TSH, free T4 wnl. 24hr urine 5mg . HgA1c 5.6.        -Failed OGTT 2abnormal values       -Currently on no meds. Diet only   Hx of C[facility]Section at [facility] for suspected macrosomia    Fetal Well  Being:   Start antenatal fetal surveillance at 32 weeks.     Plan of Care:   -No meds currently- overall ok values but need more values to fully assess  -Cont diabetic diet.   -S/p SW visit  10/09/10. S/W as needed at future visits.   -get records from c/[facility] at [facility].   -RTC in 1 week with glucose log.       Initial Labs:   Blood Type: a positive        Assessment   30 Years Old Female      G: 3   Term: 1   Preterm: 0   Ab: 1   # Living Children: 1  SAB: 1  GA: 16 4/7 weeks       EDD: 04/12/2011      Preceptor Acknowledgements   I discussed with resident and agree with resident's findings and plan as documented in resident's note.    HPI: 16 4/[redacted] weeks gestation.  GDM with early pregnancy dx and probable type 2 / Class B diabetes.  Needs more glucose value records to assess her coverage needs.  Physical Exam: As recorded.  A&P Instructed for glycemic profile monitoring and continue for another week.  RTC 1 week for decision on insulin start.  Fetal anatomy U/S at 20 weeks.    Preceptor: Leland Johns MD on November 01, 2010 10:42 AM        return in 1 week.  work excuse given.  ..................................................................Marland KitchenGeorgina Quint RN, BSN  October 30, 2010 5:04 PM          DAPP History & Education     Diabetes History:   DM Class: B - Onset at >28 years of age or duration of < 10 years  Referring Physician: Jodelle Green  CNM

## 2010-10-30 NOTE — Unmapped (Signed)
Signed by Ovidio Hanger MD on 10/30/2010 at 16:32:44  Patient: Ashlee Garcia  Note: All result statuses are Final unless otherwise noted.    Tests: (1) POC URINALYSIS (POCUA)    Glucose, Urine            Negative mg/dL              Negative    Bilirubin, Urine     [A]  Small                       Negative    Ketone                    Negative mg/dL              Negative    Specific Gravity          >=1.030                     1.005-1.035    Blood                     Negative                    Negative    pH                        5.5                         5.0-8.0    Protein, urine       [A]  30 mg/dL                    Negative    Urobilinogen              0.2 mg/dL                   1.6-1.0    Nitrite                   Negative                    Negative    Leukocyte Esterase   [A]  Small                       Negative    Note: An exclamation mark (!) indicates a result that was not dispersed into   the flowsheet.  Document Creation Date: 10/30/2010 4:25 PM  _______________________________________________________________________    (1) Order result status: Final  Collection or observation date-time: 10/30/2010 16:17  Requested date-time: 10/30/2010 16:17  Receipt date-time: 10/30/2010 16:24  Reported date-time: 10/30/2010 16:25  Referring Physician: Cindi Carbon NONSTAFF  Ordering Physician: REFERRING(UOP) NONSTAFF [facility])  Specimen Source: U&URINE     UACUP&URINALYSIS CONTAINER  Source: Faith Rogue Order Number: [id number]  Lab site: [facility]      [address]      [place] [address]  [phone]      -----------------    The following lab values were dispersed to the flowsheet  with no units conversion:      Urobilinogen, 0.2 MG/DL, (F)  expected units: E units/dL    -----------------    The following non-numeric lab results were dispersed to  the flowsheet even though numeric results were expected:  Glucose, Urine, Negative    Specific Gravity, >=1.030

## 2010-10-31 NOTE — Unmapped (Signed)
Signed by Georgina Quint RN, BSN on 10/31/2010 at 13:27:11    PHONE NOTE    PHONE NOTE  Call placed to: Patient  Reason for Call: discuss lab or test results  Details for Reason: called pt and discussed  results of FTAS. Encouraged to continue with her prenatal care and  the FTAS is a screening  test only not diagnostic.    Call placed by: Georgina Quint RN, BSN,  October 31, 2010 1:27 PM

## 2010-11-02 NOTE — Unmapped (Signed)
Signed by Leland Johns MD on 11/02/2010 at 00:00:00  BLOOD SUGAR       Imported By: Milinda Hirschfeld 11/02/2010 09:29:52    _____________________________________________________________________    External Attachment:    Please see [employer] EMR for this document.

## 2010-11-02 NOTE — Unmapped (Signed)
Signed by Clinton Quant MS ACNS [facility] on 11/07/2010 at 17:43:04    PHONE NOTE        New Medications:  BLOOD GLUCOSE MONITOR SYSTEM  KIT (BLOOD GLUCOSE MONITORING SUPPL) Use as directed  LANCETS ULTRA THIN 30G  MISC (LANCETS) Use as directed      EDD:   LMP: 07/06/2010    EDD by LMP: 04/12/2011  1st [place] date: 10/03/2010 = 12 weeks, 5 days  EDD by 1st [place]: 04/12/2011  EDD: 04/12/2011 by US1    Today's EGA in weeks: 17    Diabetes History:   DM Class: GDM vs Class B- dx < 20 weeks  Diabetes Medications Prior to Pregnancy: Glyburide, Metformin  Other Medications: Hx of taking Glyburide with last pregnancy and metformin afterwards, but was ultimately stopped.     Diabetes Treatment:     Glycemic Control:   %BG Values Outside Targets: Good (6-10%)  Hyperglycemia (BGs>200 mg/dl) Total Number: 0  Hypoglycemia (<50 mg/dl) Management: NA   Self reported BGs:  FBG 82, 79, 80, 1 hr PP breakfast 136, 102, 133; ac lunch 64; 1 hr PP lunch 97, 102; ac dinner 73, 79; 1 hr PP dinner 106, 101 mg/dL    Compliance:   Dietary Compliance: Poor  Physical Activity: Daily (>= 5 days/week and > 30 min/day)  Emotional Health: Stable   walking 40 min/day  Pt states she is not following the prescribed meal plan.  Pt reports eating rice krispie treat and orange for breakfast and donuts and coffee for breakfast 2 days this wk.  Enc. pt ot follow prescribed meal plan.    List of Diagnoses/Issues:   29yo G3P1011  Pt with concerns for her personal safety regarding the FOB        - S/p SW today (2/14) and has been crisis help line numbers.   Class B DM; diet controlled :        -Hx of GDM - took Glyburide, took Metformin PP       -TSH, free T4 wnl. 24hr urine 5mg . HgA1c 5.6.        -Failed OGTT 2abnormal values       -Currently on no meds. Pt was to record FBGB however has not yet done so secondary to no having testing supplies yet.   Hx of C-Section  A1C 5.6% on 10/08/10    Fetal Well  Being:   Start antenatal fetal surveillance at 32 weeks.      Plan of Care:   -Advanced approval for test supplies given today.   -RTC 1wk with SMBG logs to determine whether or not patient needs medication at this time.   -Cont diabetic diet.   -S/p SW visit today 10/09/10. S/W as needed at future visits.     Comments:   F/U phone call to check on pt progress.  BGs reviewed.  See DM notes.  No meds at this time.  No change to POC.  F/U 11/06/10 w/MD for DAPP visit. ..................................................................Marland KitchenClinton Quant MS [facility]  November 02, 2010 10:46 AM      Symptoms:   Complains of: none      Diabetes Treatment:     Diabetes History:   Diabetes Medications Prior to Pregnancy: Glyburide, Metformin  Other Medications: Hx of taking Glyburide with last pregnancy and metformin afterwards, but was ultimately stopped.     Current Regimen/Dose: None

## 2010-11-06 NOTE — Unmapped (Signed)
Signed by Corky Sing DO on 11/08/2010 at 14:23:31  Patient: Ashlee Garcia  Note: All result statuses are Final unless otherwise noted.    Tests: (1) POC GLU MONITORING DEVICE (POCGMD)  ! POC GLU MONITORING DEVICE                              82 mg/dL                    16-01    Note: An exclamation mark (!) indicates a result that was not dispersed into   the flowsheet.  Document Creation Date: 11/06/2010 6:02 PM  _______________________________________________________________________    (1) Order result status: Final  Collection or observation date-time: 11/06/2010 15:58  Requested date-time: 11/06/2010 15:58  Receipt date-time: 11/06/2010 17:01  Reported date-time: 11/06/2010 18:02  Referring Physician: Cindi Carbon NONSTAFF  Ordering Physician: REFERRING(UOP) NONSTAFF [facility])  Specimen Source: B&BLOOD     CAP&Capillary Collection  Source: Faith Rogue Order Number: [id number]  Lab site: [facility]      [address]      [place] [address]  [phone]

## 2010-11-06 NOTE — Unmapped (Signed)
Signed by Corky Sing DO on 11/08/2010 at 14:24:31      Reason for Visit   Chief Complaint: Return OB Visit BS 82  History from: patient    Allergies  ! * PCN    Medications  FREESTYLE FREEDOM LITE W/DEVICE KIT (BLOOD GLUCOSE MONITORING SUPPL) Use as directed  BLOOD GLUCOSE MONITOR SYSTEM  KIT (BLOOD GLUCOSE MONITORING SUPPL) Use as directed  LANCETS ULTRA THIN 30G  MISC (LANCETS) Use as directed        Vital Signs   Height: 64 in.   Weight: 180.9 lbs.   Pre-Pregnancy Weight: 179 lbs.    Wt change since last visit: -1.20 lbs      Total Preg Wt Change: 1.90 lbs   BMI (in-lb): 31.16   BSA (m2): 1.88  Pulse rate: 75   Temperature: 99.0 degrees  F   Blood Pressure   BP #1: 101 / 57mm Hg  Cuff Size: Std     Pain:     Have you had pain other than everyday aches and pains (e.g., mild headache, back ache, strains) in the past week?   No    Intake recorded by: Collier Salina MA on November 06, 2010 3:59 PM             RN intake: 30 y.o. G3P1 at 17.[redacted] weeks gestation  Type 2 DM ? currently not on meds  RBS 82  not really feeling fetal activity due to gestational age  b anatomy scan 11/20/10 0900  pt not good at taking blood sugars and is not following the diet.  Complains of fatigue and decrease appetite  vomiting on occasion  pt states she has adequate supplies to monitor her Blood sugars  no hospitalizations and no concerns to report.  .................................................................Marland KitchenDebra A. Wyn Quaker RN,BSN November 06, 2010 1640    Menses History:   Hospital [facility] Delivery[facility]  First day LMP: 07/06/2010  This date is: definite  Menses monthly: yes  Frequency:  28 days  Contraception used at conception: None  Planned pregnancy: no  Pregnancy confirmed by: serum HCG  Date confirmed: 10/07/2010  G: 3  P 1:     Term: 1   Preterm: 0   Ab: 1   # Living Children: 1  SAB: 1    Other Pertinent History:   Pre-Pregnacy Tobacco Use per Day: 0  Pregnancy Tobacco Use per Day: 0  Pre-Pregnacy Alcohol Use per  Day: 0  Pregnancy Alcohol Use per Day: 0  Pre-Pregnancy Illicit/Recreational Drug Use per Day: 0  Pregnancy Illicit/Recreational Drug Use per Day: 0  D(Rh) Sensitized: No  Latex Allergy: No  Betadine Allergy: No  Anesthetic Complications No  History of Abnormal Pap: No  Uterine Anomaly: No  History of Infertility: No  DES Exposure: No  ART Treatment: No      Infection History:   Exposed to TB: no   History of Genital Herpes: yes - partner has genital herpes  Rash or Viral Illness since LMP: no  Hepatitis B: no  Hepatitis C: no  History of STD: no  History of PID: no  Other Infection History Comments: at risk for HIv having unprotected sex , had trich at the age of 30 but was treated       Genetic Testing/Teratology Counseling:   Patient's Age>= 35 Years as of Estimated Date of Delivery: no  Thalassemia Risk: no  Neural Tube Defect: no  Congenital Heart Defect: no   Down Syndrome: no  Tay-Sachs: no  Canavan Disease: no  Familial Dysautonomia: no  Sickle Cell Disease/Trait: no  Hemophilia/Blood Disorders: no  Muscular Dystrophy: no  Cystic Fibrosis: no  Huntington's Chorea: no  Mental Retardation/Autism no  Tested for Fragile X: no  Other Genetic or Chromosomal: no  Maternal Metabolic Disorder: no  Other birth defects: no  Recurrent Pregnancy Loss/Stillbirth: no  Medications/Drugs/Alcohol since LMP: no    EDD:   LMP: 07/06/2010    EDD by LMP: 04/12/2011  1st [place] date: 10/03/2010 = 12  weeks, 5  days  EDD by 1st [place]: 04/12/2011  EDD: 04/12/2011 by Junius Creamer    Today's EGA in weeks: 17 4/7    History of Present Illness:   Decreased appetite. Vomited x 1 this week. No F/C/D.    Symptoms:   Complains of: nausea, vomiting  Denies: headache, heartburn, pelvic pressure, preterm labor symptoms, swelling, uterine activity, vaginal bleeding, loss of fluid  Fetal movement: possible    Diabetes History:   Age at Diagnosis: 26  Type: DM Type 2  DM Class: B - Onset at >40 years of age or duration of < 10 years  Diabetes Medications Prior  to Pregnancy: Glyburide, Metformin  Other Medications: Hx of taking Glyburide with last pregnancy and metformin afterwards, but was ultimately stopped.   Other History: hx of GDM with last pregnancy 05/06/06  SAB in 03/2008 at 8 wks    Diabetes Treatment:     Glycemic Control:   %BG Values Outside Targets: Good (6-10%)  Hyperglycemia (BGs>200 mg/dl) Total Number: 0  Hypoglycemia (<50 mg/dl) Management: NA    Compliance:   Dietary Compliance: Fair  Physical Activity: Daily (>= 5 days/week and > 30 min/day)  Emotional Health: Stable   Walks 40 mins per day    Prenatal Exam:     Weight    Pre-Pregnancy Weight: 179 lbs.    Exam   Est. Gestational Age: 59 4/7  Comments: rbg 82 mg/dL    List of Diagnoses/Issues:   30yo G3P1011  Pt with concerns for her personal safety regarding the FOB        - S/p SW  (2/14) and has been crisis help line numbers.   Class B DM:        -Hx of GDM - took Glyburide, took Metformin PP       -TSH, free T4 wnl. 24hr urine 5mg . HgA1c 5.6.        -Failed OGTT 2abnormal values       -Currently on no meds. Diet only.  Hx of C-Section at [facility] for suspected macrosomia     - Desires TOLAC if natural labor prior to 39 weeks, desires RCS if not in labor by 39 weeks  Declines flu shot    Fetal Well  Being:   Start antenatal fetal surveillance at 32 weeks.     Plan of Care:   -No meds currently- overall ok values but need more values to fully assess  -Cont diabetic diet - encouradge compliance  -S/p SW visit  10/09/10. S/W as needed at future visits.   -get records from c/s at [facility].   - Will call NL with BS logs every Friday (trying to avoid missing work for appointments)  -RTC in 3 week with glucose log.       Initial Labs:   Blood Type: a positive        Assessment   30 Years Old Female      G: 3  P 1:     Term: 1   Preterm: 0   Ab: 1   # Living Children: 1  SAB: 1  GA: 17 4/7 weeks       EDD: 04/12/2011    Patient Instructions   Call Friday with your blood glucose  results.    Disposition:   Clinic: DAPP  Primary OB Provider: any  in 2 week(s)       Preceptor Acknowledgements   I discussed with resident and agree with resident's findings and plan as documented in resident's note.    HPI: 17 4/[redacted] weeks gestation.  Class B diabetes by somewhat unusual hx and + early pregnancy testing.  Not currently on diabetes meds but is not reguarly recording glucose values.  There are some compliance issues at present.  Physical Exam: As recorded.  A&P Reinstructed for glucose monitoring.  Call NCL with values in 3 days and then review need for glycemic coverage.  Schedule for fetal anatomy scan.  RTC 2 weeks.    Preceptor: Leland Johns MD on November 06, 2010 11:54 PM          DAPP History & Education     Diabetes History:   Age at Diagnosis: 51  Type: DM Type 2  DM Class: B - Onset at >89 years of age or duration of < 10 years  Referring Physician: Jodelle Green CNM

## 2010-11-09 NOTE — Unmapped (Signed)
Signed by Leland Johns MD on 11/09/2010 at 00:00:00  BLOOD SUGARS      Imported By: Milinda Hirschfeld 11/09/2010 09:29:21    _____________________________________________________________________    External Attachment:    Please see [employer] EMR for this document.

## 2010-11-20 NOTE — Unmapped (Signed)
Signed by Parks Neptune MD on 11/28/2010 at 11:48:18  OB US      Imported By: Georgianne Fick 11/20/2010 17:36:49    _____________________________________________________________________    External Attachment:    Please see [employer] EMR for this document.

## 2010-11-23 NOTE — Unmapped (Signed)
Signed by Leland Johns MD on 11/23/2010 at 00:00:00  PRACTICE GUIDELINES      Imported By: Milinda Hirschfeld 11/23/2010 11:15:54    _____________________________________________________________________    External Attachment:    Please see [employer] EMR for this document.

## 2010-11-27 LAB — OFFICE VISIT LAB RESULTS
Hep B Surface Ag: NEGATIVE
Rubella Antibodies: IMMUNE

## 2010-11-27 LAB — HIV 1+2 ANTIBODY/ANTIGEN WITH REFLEX: HIV-1/HIV-2 Ab: NEGATIVE

## 2010-11-27 NOTE — Unmapped (Signed)
Signed by Leland Johns MD on 12/11/2010 at 12:29:39      Reason for Visit   Chief Complaint: Return OB Visit BS 90  History from: patient    Allergies  ! * PCN    Medications  FREESTYLE FREEDOM LITE W/DEVICE KIT (BLOOD GLUCOSE MONITORING SUPPL) Use as directed  BLOOD GLUCOSE MONITOR SYSTEM  KIT (BLOOD GLUCOSE MONITORING SUPPL) Use as directed  LANCETS ULTRA THIN 30G  MISC (LANCETS) Use as directed        Vital Signs   Height: 64 in.   Weight: 179.3 lbs.   Pre-Pregnancy Weight: 179 lbs.    Wt change since last visit: -1.60 lbs      Total Preg Wt Change: 0.30 lbs   BMI (in-lb): 30.89   BSA (m2): 1.87  Pulse rate: 87   Temperature: 99.0 degrees  F   Blood Pressure   BP #1: 100 / 60mm Hg  Cuff Size: Std     Pain:     Have you had pain other than everyday aches and pains (e.g., mild headache, back ache, strains) in the past week?   No    Intake recorded by: Collier Salina MA on November 27, 2010 3:46 PM             RN intake: 30 y.o. G3P1 at 20.[redacted] weeks gestation  ?GDMA1 vs Type 2 DM  RBS 90  Feeling fetal activity  no log or meter.  pt states she has adequate supplies to manage her diabetes  no recent hospitalizations  no concerns  ..................................Marland KitchenDebra A. Wyn Quaker, RN,BSN November 27, 2010 1556    Menses History:   [facility]ospital [facility] Delivery[facility]  First day LMP: 07/06/2010  This date is: definite  Menses monthly: yes  Frequency:  28 days  Contraception used at conception: None  Planned pregnancy: no  Pregnancy confirmed by: serum HCG  Date confirmed: 10/07/2010  G: 3  P 1:     Term: 1   Preterm: 0   Ab: 1   # Living Children: 1  SAB: 1    Other Pertinent History:   Pre-Pregnacy Tobacco Use per Day: 0  Pregnancy Tobacco Use per Day: 0  Pre-Pregnacy Alcohol Use per Day: 0  Pregnancy Alcohol Use per Day: 0  Pre-Pregnancy Illicit/Recreational Drug Use per Day: 0  Pregnancy Illicit/Recreational Drug Use per Day: 0  D(Rh) Sensitized: No  Latex Allergy: No  Betadine Allergy: No  Anesthetic Complications  No  History of Abnormal Pap: No  Uterine Anomaly: No  History of Infertility: No  DES Exposure: No  ART Treatment: No      Infection History:   Exposed to TB: no   History of Genital Herpes: yes - partner has genital herpes  Rash or Viral Illness since LMP: no  Hepatitis B: no  Hepatitis C: no  History of STD: no  History of PID: no  Other Infection History Comments: at risk for HIv having unprotected sex , had trich at the age of 59 but was treated       Genetic Testing/Teratology Counseling:   Patient's Age>= 35 Years as of Estimated Date of Delivery: no  Thalassemia Risk: no  Neural Tube Defect: no  Congenital Heart Defect: no   Down Syndrome: no  Tay-Sachs: no  Canavan Disease: no  Familial Dysautonomia: no  Sickle Cell Disease/Trait: no  Hemophilia/Blood Disorders: no  Muscular Dystrophy: no  Cystic Fibrosis: no  Huntington's Chorea: no  Mental Retardation/Autism no  Tested for Fragile X: no  Other Genetic or Chromosomal: no  Maternal Metabolic Disorder: no  Other birth defects: no  Recurrent Pregnancy Loss/Stillbirth: no  Medications/Drugs/Alcohol since LMP: no    EDD:   LMP: 07/06/2010    EDD by LMP: 04/12/2011  1st [place] date: 10/03/2010 = 12  weeks, 5  days  EDD by 1st [place]: 04/12/2011  EDD: 04/12/2011 by Junius Creamer    Today's EGA in weeks: 20 4/7    Symptoms:   Denies: back pain, change in discharge, constipation, fatigue, headache, heartburn, nausea, vomiting, pelvic pressure, preterm labor symptoms, swelling, uterine activity, vaginal bleeding, loss of fluid, concerns for personal safety  Fetal movement: active baby    Diabetes History:   Age at Diagnosis: 26  Type: DM Type 2  DM Class: B - Onset at >62 years of age or duration of < 10 years  Diabetes Medications Prior to Pregnancy: Glyburide, Metformin  Other Medications: Hx of taking Glyburide with last pregnancy and metformin afterwards, but was ultimately stopped.   Other History: hx of GDM with last pregnancy 05/06/06  SAB in 03/2008 at 8 wks    Diabetes  Treatment:     Glycemic Control:    Patient brought no logs.    Compliance:   Physical Activity: Other  Emotional Health: Stable    Prenatal Exam:     Weight    Pre-Pregnancy Weight: 179 lbs.    Exam   Est. Gestational Age: 44 4/7  Fundal height (cm): at umbilicus  FHR: 140's  Edema: --    List of Diagnoses/Issues:   30yo G3P1011  Pt with concerns for her personal safety regarding the FOB        - S/p SW  (2/14) and has been crisis help line numbers.   Class B DM:        -Hx of GDM - took Glyburide, took Metformin PP       -TSH, free T4 wnl. 24hr urine 5mg . HgA1c 5.6.        -Failed OGTT 2abnormal values       -Currently on no meds. Diet only.  fetal VSD seen anatomy scan       -fetal echo ordered  Hx of C-Section at [facility] for suspected macrosomia     - Desires TOLAC if natural labor prior to 39 weeks, desires RCS if not in labor by 39 weeks  Declines flu shot    Fetal Well  Being:   Start antenatal fetal surveillance at 32 weeks.     Plan of Care:   -No meds currently- overall ok values but need more values to fully assess  -Cont diabetic diet - encouradge compliance  -S/p SW visit  10/09/10. S/W as needed at future visits.   -get records from c/s at [facility].   - Will call NL with BS logs every Friday (trying to avoid missing work for appointments)  -RTC in 3 week with glucose log.       Initial Labs:   Blood Type: a positive  D (Rh) Type: +  Rubella: immune  RPR: nonreactive  HBsAg: negative        Assessment   30 Years Old Female      G: 3  P 1:     Term: 1   Preterm: 0   Ab: 1   # Living Children: 1  SAB: 1  GA: 20 4/7 weeks       EDD: 04/12/2011  New Problems:   VSD (ICD-745.4)      Status of Existing Problems  Assessed VSD as new - VSD seen on anatomy scan  schedule fetal echo - Janith Lima MD  Assessed DIABETES MELLITUS, GESTATIONAL, HX OF as unchanged - 30yo  GDMA1 noncompliant with testing.  Did not bring logs.  RTC in 1 wk with record. Janith Lima MD  Assessed  OVERWEIGHT as unchanged - Fatima Blank Kazi Montoro MD    Today's Orders   Fetal Echocardiogram [[id number]]  HIV Antibody    [facility]) [phone]) [[id number]]  Urine Culture   ([facility]) (395) [[id number]]    Disposition:   Clinic: DAPP  Primary OB Provider: any  in 1 week(s)   Appointment Reason: ROB    *Patient Care Summary printed and given to patient.      Nature conservation officer   I discussed with resident and agree with resident's findings and plan as documented in resident's note.    HPI: 20 4/[redacted] weeks gestation.  Class B diabetes on diet only, no meds.  Difficult to assess her level of glycemic control without more complete glucose reocrds, patient instructed.    Physical Exam: As recorded.  A&P Continue current management but collect glycemic profile.  Call NCL with values in 1 weeks.  RTC 3 weeks or sooner as necessary.    Preceptor: Leland Johns MD on December 11, 2010 12:29 PM        Rn exit-  Patient to return in 1 week to DAPP on APril 10 that at 3:20.  HIV and urine culture done today.  Reminded patient to bring blood sugar logs next week and to call Harriett Sine with any Blood Sugar concerns.  Fetal Echo order sent to Dan in Fetal care with OB history. ..................................................................Marland KitchenAutumn Forsythe RN  November 27, 2010 5:14 PM          DAPP History & Education     Diabetes History:   Age at Diagnosis: 50  Type: DM Type 2  DM Class: B - Onset at >39 years of age or duration of < 10 years  Referring Physician: Jodelle Green CNM

## 2010-11-27 NOTE — Unmapped (Signed)
Signed by Fatima Blank Cherron Blitzer MD on 11/28/2010 at 16:23:15  Patient: Ashlee Garcia  Note: All result statuses are Final unless otherwise noted.    Tests: (1) POC GLU MONITORING DEVICE (POCGMD)  ! POC GLU MONITORING DEVICE                              90 mg/dL                    16-01    Note: An exclamation mark (!) indicates a result that was not dispersed into   the flowsheet.  Document Creation Date: 11/28/2010 7:31 AM  _______________________________________________________________________    (1) Order result status: Final  Collection or observation date-time: 11/27/2010 15:45  Requested date-time: 11/27/2010 15:45  Receipt date-time: 11/28/2010 06:30  Reported date-time: 11/28/2010 07:31  Referring Physician: Cindi Carbon NONSTAFF  Ordering Physician: Roda Shutters [facility])  Specimen Source: B&BLOOD     CAP&Capillary Collection  Source: Faith Rogue Order Number: [id number]  Lab site: [facility]      [address]      [place] [address]  [phone]

## 2010-11-27 NOTE — Unmapped (Signed)
Signed by Parks Neptune MD on 11/28/2010 at 11:57:50  Patient: Ashlee Garcia  Note: All result statuses are Final unless otherwise noted.    Tests: (1) HIVR (HIV1AND2) (HIVR)  ! HIV 1/O/2 Abs-Index       <1.00 INDEX                 <1.00    HIV 1/O/2 Abs, Qual       Negative                    Negative    Note: An exclamation mark (!) indicates a result that was not dispersed into   the flowsheet.  Document Creation Date: 11/27/2010 6:36 PM  _______________________________________________________________________    (1) Order result status: Final  Collection or observation date-time: 11/27/2010 16:50  Requested date-time: 11/27/2010 16:50  Receipt date-time: 11/27/2010 17:12  Reported date-time: 11/27/2010 18:36  Referring Physician: Cindi Carbon NONSTAFF  Ordering Physician: Roda Shutters [facility])  Specimen Source: S&SERUM     SST UNOPEN&SST unopened (4 ml S Refrig)  Source: Faith Rogue Order Number: [id number]  Lab site: [facility]      [address]      [place] [address]  [phone]

## 2010-11-28 NOTE — Unmapped (Signed)
Signed by Parks Neptune MD on 11/28/2010 at 11:57:50            Clinical Issues:   Complete Problem List:   SUSPECTED FETAL ANOMALY NOT FOUND (ICD-V89.03)  DIABETES MELLITUS, TYPE II, CONTROLLED (ICD-250.00)  DIABETES MELLITUS, GESTATIONAL, HX OF (ICD-V13.29)  PREGNANCY, HIGH RISK (ICD-V23.9)  OVERWEIGHT (ICD-278.02)  PREGNANCY, MULTIGRAVIDA (ICD-V22.1)      Current Medications:   FREESTYLE FREEDOM LITE W/DEVICE KIT (BLOOD GLUCOSE MONITORING SUPPL) Use as directed  BLOOD GLUCOSE MONITOR SYSTEM  KIT (BLOOD GLUCOSE MONITORING SUPPL) Use as directed  LANCETS ULTRA THIN 30G  MISC (LANCETS) Use as directed      List of Diagnoses/Issues:   29yo G3P1011  Pt with concerns for her personal safety regarding the FOB        - S/p SW  (2/14) and has been crisis help line numbers.   Class B DM:        -Hx of GDM - took Glyburide, took Metformin PP       -TSH, free T4 wnl. 24hr urine 5mg . HgA1c 5.6.        -Failed OGTT 2abnormal values       -Currently on no meds. Diet only.  Hx of C-Section at [facility] for suspected macrosomia     - Desires TOLAC if natural labor prior to 39 weeks, desires RCS if not in labor by 39 weeks  Declines flu shot  Suspected VSD on [place] from 3/27   - Will need Fetal Echo at [facility] and f/up US in 4wk (approx 12/21/10)    Fetal Well  Being:   Start antenatal fetal surveillance at 32 weeks.     Plan of Care:   -No meds currently- overall ok values but need more values to fully assess  -Cont diabetic diet - encouradge compliance  -S/p SW visit  10/09/10. S/W as needed at future visits.   -get records from c/s at [facility].   - Will call NL with BS logs every Friday (trying to avoid missing work for appointments)  -RTC in 3 week with glucose log.       OB Ultrasound   1st Ultrasound Date:  11/20/2010  GA:  19+4  Est. Fetal Wt:  339g  Placenta:  post  Fluid:  wnl  Anatomy:  VSD?  Recommendations:  F/up 4wk [place] and Fetal Echo at [facility]  [place] Comments:  Suspected VSD      EDD:   Pregnancy Dating  Information:    LMP: 07/06/2010    EDD by LMP: 04/12/2011  1st [place] date: 10/03/2010 = 12  weeks, 5  days  EDD by 1st [place]: 04/12/2011  2nd [place] date: 11/20/2010 = 19  weeks, 4  days  EDD by 2nd [place]: 04/12/2011  EDD: 04/12/2011 by US2    Today's EGA in weeks: 20 5/7

## 2010-11-30 NOTE — Unmapped (Signed)
Signed by Leland Johns MD on 11/30/2010 at 00:00:00  FETAL ECHO ORDER       Imported By: Milinda Hirschfeld 11/30/2010 10:37:10    _____________________________________________________________________    External Attachment:    Please see [facility] EMR for this document.

## 2011-01-08 NOTE — Discharge Summary (Signed)
NAMEMANDEE, PLUTA            ACCOUNT NO.:  [id number]   MEDICAL RECORD NO.:  [id number]          PATIENT TYPE:  INP   LOCATION:  9107                          [facility]ILITY:  [facility]   PHYSICIAN:  Malachi Pro. Ambrose Mantle, M.D. DATE OF BIRTH:  Sep 23, 1980   DATE OF ADMISSION:  11/19/2007  DATE OF DISCHARGE:  11/22/2007                               DISCHARGE SUMMARY   HISTORY OF PRESENT ILLNESS:  A 30 year old black female para 2-0-0-2,  gravida 3.  Estimated gestational age 21+ weeks by last period  compatible with a 20-week ultrasound with [facility] of December 04, 2007  presenting complaining of regular contractions.  She was evaluated in  the maternity admission unit with contractions every 5-7 minutes.  Cervix was 3 centimeters and fetal heart tone showed variable  decelerations.  Blood group and type A positive, negative antibody, RPR  nonreactive, rubella immune, hepatitis B surface antigen negative, HIV  negative, GC and chlamydia negative, 1-hour Glucola 106, group B  streptococcus negative.  The patient had a previous low-transverse  cervical C-section, desired a vaginal birth after cesarean.  She had  delivered spontaneously vaginally at 37 weeks in 2005;  a 5 pound 15  ounce baby with respiratory difficulty, and in 2006, she had low-  transverse cervical C-section at 40 weeks with a 7-pound 6-ounce infant  with prolapsed cord.  She had a remote history of chlamydia.   PAST MEDICAL HISTORY:  Asthma.   PAST SURGICAL HISTORY:  C-section.   MEDICATIONS:  Prenatal vitamins and iron.   HOSPITAL COURSE:  On admission, her vital signs were normal.  Heart and  lungs were normal except for 2/6 systolic ejection murmur.  Her cervix  was 4 cm, 80% artificial rupture of membranes produced clear fluid on  intrauterine pressure catheter and scalp electrode were placed.  The  patient wanted to proceed with a VBAC.  She underwent amnioinfusion.  By  8:45 a.m., her cervix was 4-5 centimeters.  Pitocin was  started and at  11:00 a.m., Dr. Senaida Ores was called for repetitive decelerations.  Cervix was unchanged at 5 centimeters.  She discussed with the patient  that the fetal heart rate was not tolerant of contractions, even though  the contractions were not even adequate.  She advised the patient it was  safest to proceed with a repeat C-section and not push the baby given  the relative intolerance to labor.  The patient underwent a low-  transverse cervical C-section with delivery of a living female infant 5  pounds 14 ounces, Apgars of 9 at 1 and 9 at 5 minutes.  Cord pH was  7.27.  Uterus tubes and ovaries appeared normal.  Scar was intact.  Postpartum, the patient did quite well and was discharged on the third  postpartum day.  Staples were removed and strips were applied.  Initial  hemoglobin was 10.3, hematocrit 31.0, white count 7200, and platelet  count 216,000.  Followup hemoglobins were 9.6 and 8.9.  RPR was  nonreactive.   FINAL DIAGNOSIS:  Intrauterine pregnancy at 37+ weeks with prior  cesarean section, desired vaginal birth after cesarean section, failed  vaginal birth after cesarean section, repetitive decelerations of the  fetal heart rate.   OPERATION:  Low-transverse cervical C-section.   FINAL CONDITION:  Improved.   INSTRUCTIONS:  Include regular discharge instruction booklet.  The  patient is advised to return to the office in 2 weeks for follow up  examination.  Percocet 5/325, 36 tablets one q.4-6 h. as needed for pain  is given at discharge.      Malachi Pro. Ambrose Mantle, M.D.  Electronically Signed     TFH/MEDQ  D:  11/22/2007  T:  11/22/2007  Job:  [id number]

## 2011-01-08 NOTE — Op Note (Signed)
[name], [name]            ACCOUNT NO.:  [id number]   MEDICAL RECORD NO.:  [id number]          PATIENT TYPE:  INP   LOCATION:  9167                          [facility]  [facility]   PHYSICIAN:  Huel Cote, M.D. DATE OF BIRTH:  08-21-1981   DATE OF PROCEDURE:  11/19/2007  DATE OF DISCHARGE:                               OPERATIVE REPORT   PREOPERATIVE DIAGNOSES:  1. Previous cesarean section.  2. Failed vaginal birth after cesarean.  3. Nonreassuring fetal tracing.   POSTOPERATIVE DIAGNOSES:  1. Previous cesarean section.  2. Failed vaginal birth after cesarean.  3. Nonreassuring fetal tracing.  4. Nuchal cord x2.   PROCEDURE:  Repeat low transverse C-section with two-layer closure of  uterus.   SURGEON:  Huel Cote, M.D.   ANESTHESIA:  Epidural.   SPECIMENS:  Placenta was sent to labor and delivery.   ESTIMATED BLOOD LOSS:  700 mL.   URINE OUTPUT:  200 mL clear urine.   IV FLUIDS:  2000 mL LR.   FINDINGS:  There was a viable female infant in the vertex presentation,  Apgars were 9/9, weight was 5 pounds 14 ounces.  Cord pH was 7.27.  There was a normal uterus, ovaries and tubes noted.  Scar was intact.   PROCEDURE:  The patient was consented after was found to have repetitive  deep variable decelerations in labor, remote from delivery at 5 cm, and  an intolerance to Pitocin.  She agreed to a repeat C-section.  She was  then taken to the operating room where epidural anesthesia was found to  be adequate by Allis clamp test.   With a Foley catheter in place, the abdomen was examined and a  Pfannenstiel skin incision made through a preexisting scar.  This was  carried down to the layer of fascia by sharp dissection and Bovie  cautery.  The fascia was then nicked in the midline, and the incision  was extended laterally.  The inferior aspect was elevated and dissected  off the rectus muscles.  The superior aspect was elevated and dissected  off the rectus  muscles.  The rectus muscles were separated in the  midline and the peritoneal cavity entered bluntly.  The peritoneal  incision was then extended both superiorly and inferiorly, with careful  attention to avoid both bowel and bladder.  The Alexis self-retaining  wound retractor was then placed within the abdomen, and the lower  uterine segment was incised in a transverse fashion.  The cavity itself  was entered bluntly.  The fluid was noted to be slightly blood-tinged,  and the infant was noted to have a double nuchal cord.  The infant's  head was delivered atraumatically.  The nose and mouth bulb were bulb  suctioned.  The remainder of the body delivered without difficulty.  The  infant was handed to the awaiting pediatricians after the cord was  clamped and cut and doing well.  Cord pH and cord blood was obtained,  and the placenta was then expressed spontaneously.  The uterus was  cleared of all clots and debris with a moist lap sponge.  The uterine  incision was then repaired in two layers, the first a running locked  layer of 0 chromic and the second an imbricating layer of the same.  At  the conclusion of the closure, all appeared hemostatic.  The bladder  flap appeared well away from the incision, and the gutters were cleared  of all clots and debris with a moist lap sponge.  The ovaries and tubes  were inspected and found to be normal.  The abdomen and pelvis were  irrigated and no active bleeding noted.  Any small areas of oozing were  treated with Bovie cautery.  At this point, all instruments and sponges  were removed from the abdomen.  The rectus muscles were reapproximated,  as they were slightly gaping, with several interrupted mattress sutures  of 0 Vicryl.  The fascia was then closed with 0 Vicryl in a running  fashion, and the skin was closed with staples.   Again sponge, lap and needle counts were correct x2, and the patient was  taken to the recovery room in stable  condition.  The baby was taken to  the newborn nursery and doing well after delivery.      Huel Cote, M.D.  Electronically Signed     KR/MEDQ  D:  11/19/2007  T:  11/20/2007  Job:  [id number]

## 2011-01-11 NOTE — H&P (Signed)
NAMEDEBORH, PENSE            ACCOUNT NO.:  [id number]   MEDICAL RECORD NO.:  [id number]          PATIENT TYPE:  INP   LOCATION:  9143                          [facility]   PHYSICIAN:  Malachi Pro. Ambrose Mantle, M.D. DATE OF BIRTH:  1981-01-17   DATE OF ADMISSION:  05/14/2005  DATE OF DISCHARGE:                                HISTORY & PHYSICAL   This is a 30 year old black female para, 1-0-1-1, gravida 3, EDC April 24, 2005, by dates and May 10, 2005, by ultrasound at 25 weeks 5 days,  admitted for induction after a nonreactive nonstress test.  Blood group an  type A+, negative antibody, sickle-cell negative, RPR nonreactive, rubella  immune, hepatitis B surface antigen negative, HIV negative, GC and Chlamydia  negative, group B strep positive, 1-hour Glucola 136.  Prenatal care began  on January 25, 2005.  Ultrasound on January 31, 2005, average gestational age [redacted]  weeks 5 days, [facility] May 10, 2005.  Repeat ultrasound on February 18, 2005,  showed an every gestational age of [redacted] weeks 1 day, [facility] May 12, 2005.  Prenatal course was uncomplicated after it began, but nonstress tests today  for term assessment was nonreactive in our office.  The patient was admitted  for induction.   Her past medical history revealed no allergies, illnesses, Chlamydia years  ago, asthma and migraines.  No operations.   Alcohol, tobacco and drugs:  None.   FAMILY HISTORY:  Father with high blood pressure.   OBSTETRIC HISTORY:  2001, an early abortion.  September 2005, a 5 pound 15  ounces female vaginally at 37+ weeks.  The baby went to NICU for RDS.   PHYSICAL EXAMINATION:  VITAL SIGNS:  Physical exam on admission revealed  normal vital signs.  CARDIAC:  The heart was normal size with no murmurs.  CHEST:  Lungs were clear to auscultation.  ABDOMEN:  Fundal height had been 38 cm on May 10, 2005.  Fetal heart  tones showed no decelerations, occasional accelerations.  PELVIC:  Cervix was 3  cm, 50%, vertex at a -2 to -3.  Artificial rupture of  the membranes produced meconium-stained fluid.   The patient was placed on Pitocin, given IV penicillin.  At 2:15 p.m., the  nurse had called the patient 3-4 cm.  The patient was in pain and asked for  pain medication.  I okayed an epidural.  I called the nurse at 3:30 p.m. for  an update.  She had not reexamined the patient.  The nurses were  changing  shift at that time and one nurse was transferring the patient to another  nurse.  I asked that the patient be examined and to give me a follow-up  call.  When I was next called, I was informed that the patient was in the  operating room for a C-section for a prolapsed cord.  I went immediately to  the hospital.  When I arrived, Dr. Shawnie Pons had delivered the 7 pound 6 ounces  female infant by C-section and had almost completed closed the uterine  incision.  In a follow-up from the nurse  that had  taken over the patient's  care, she informed me that she went in to examine the patient, a lot of  fluid came out when she examined the patient and the cord prolapsed.  She  said it was not just a short segment of cord but a fairly large segment and  did not feel any pulsations in the cord.  Dr. Shawnie Pons happened to be on the  floor and they took the patient immediately to the operating room and again  there could not confirm either by. Doppler or by palpating the cord that the  fetal heart rate was present.  Dr. Shawnie Pons proceeded with delivery and since  the baby had a normal pH and high Apgar, it was obvious that the fetal heart  rate was present but they were unable to detect it.  I do not know if the  patient had a single or double-layer closure of the uterus, but I will ask  Dr. Shawnie Pons prior to the patient's discharge so I will be able to give her  informed consent about the advisability of repeat C-section versus vaginal  birth after cesarean.      Malachi Pro. Ambrose Mantle, M.D.  Electronically  Signed     TFH/MEDQ  D:  05/14/2005  T:  05/15/2005  Job:  [id number]

## 2011-01-11 NOTE — Op Note (Signed)
[name], [name]            ACCOUNT NO.:  [id number]   MEDICAL RECORD NO.:  [id number]          PATIENT TYPE:  INP   LOCATION:  9143                          [facility]   PHYSICIAN:  Malachi Pro. Ambrose Mantle, M.D. DATE OF BIRTH:  [birth year]   DATE OF PROCEDURE:  05/14/2005  DATE OF DISCHARGE:                                 OPERATIVE REPORT   PREOPERATIVE DIAGNOSES:  1.  Intrauterine pregnancy at 40+ weeks.  2.  Prolapsed cord.   POSTOPERATIVE DIAGNOSES:  1.  Intrauterine pregnancy at 40+ weeks.  2.  Prolapsed cord.   OPERATION:  Low transverse cervical cesarean section.  The laparotomy and  delivery the baby and closure of the uterus was done by Dr. Shawnie Pons.  I  arrived and did the closure of the abdominal wall.   The procedure was done under epidural anesthesia.   Blood loss estimated at 1000 mL.  I had talked to the patient's nurses 3:30 p.m. and asked that the patient be  examined.  She had last been examined that 2:15 p.m. when she was 3-4 cm and  had requested an epidural.  I okayed the epidural.  The next I was in  contact with the hospital regarding this patient, I was called and told that  my patient was in the operating room having a C-section for a prolapsed  cord, so I immediately came to [facility], scrubbed and gowned and  completed the remainder of the operation.  Prior to that Dr. Shawnie Pons had done  a low transverse skin incision, carried it through the layers through the  skin and subcutaneous tissue and fascia and then opened the abdomen through  the peritoneum.  She had made a low transverse incision in the uterus and  delivered a living female infant, 7 pounds 6 ounces, with Apgars of 8 and 9,  and the cord pH was reported as 7.22.  Dr. Shawnie Pons had proceeded with the C-  section in my absence because the nurses had told her that they did not feel  the umbilical cord pulsating and also a Doppler of the abdomen failed to  reveal the fetal heart rate.  Dr.  Shawnie Pons entered the uterus transversely,  delivered the infant, as I said, 7 pounds 6 ounces with Apgars of 8 and 9,  and then removed the placenta and closed the uterine incision.  At this  point I was in the operating room and liberal irrigation confirmed  hemostasis.  Both tubes and ovaries and the uterus appeared normal, and I  closed the abdominal wall with interrupted sutures of 0 Vicryl, closing the  rectus muscle and the peritoneum in one layer.  I then closed the rectus  fascia with one running suture of 0 Vicryl and the subcu tissue with a  running 3-0 Vicryl and the skin was closed with automatic staples.  The  patient seemed to tolerate the procedure well.  Blood loss was estimated at  1000 mL.  Sponge and needle counts were not done, so an x-ray was taken to  confirm that there was no foreign body in the abdomen, and  then the patient  was returned to recovery.  I will ask Dr. Shawnie Pons prior to the patient's  discharge if she closed the uterus in one or two layers so I will be able to  give the patient informed consent about the advisability of a vaginal birth  after cesarean.      Malachi Pro. Ambrose Mantle, M.D.  Electronically Signed     TFH/MEDQ  D:  05/14/2005  T:  05/15/2005  Job:  [id number]

## 2011-01-11 NOTE — Discharge Summary (Signed)
[name], [name]                        ACCOUNT NO.:  [id number]   MEDICAL RECORD NO.:  [id number]                   PATIENT TYPE:  INP   LOCATION:  9309                                 [facility]   PHYSICIAN:  Malachi Pro. Ambrose Mantle, M.D.              DATE OF BIRTH:  Sep 10, 1980   DATE OF ADMISSION:  05/05/2004  DATE OF DISCHARGE:                                 DISCHARGE SUMMARY   HOSPITAL COURSE:  A 30 year old black single female para 0-0-1-0 gravida 2;  [facility] May 21, 2004 by ultrasound; admitted in early labor with ruptured  membranes.  Blood group and type A positive with a negative antibody, sickle  cell negative, RPR nonreactive, rubella immune, hepatitis B surface antigen  negative, HIV negative, GC and chlamydia negative, triple screen declined, 1-  hour Glucola 138, group B strep positive.  Vaginal ultrasound on November 01, 2003:  Crown-rump length 4.37 cm; 11 weeks 1 day; [facility] May 21, 2004.  Repeat ultrasound on December 21, 2003 showed an [facility] of May 25, 2004.  On  May 02, 2004 the patient complained of leaking fluid.  She was  evaluated on May 03, 2004 with negative fern.  Nonstress test was  reactive on May 02, 2004 because of possible small for dates.  The  patient began contracting at approximately 9 p.m. on the day of admission.  She came to maternity admission for evaluation, was found to have ruptured  membranes and the cervix was 3 cm, 90%, vertex at a -1.  Past medical  history:  No known allergies, no operations.  GYN history:  Chlamydia.  Illnesses:  Migraines and asthma.  Alcohol, tobacco, and drugs:  None.  Family history:  Father with high blood pressure.  Obstetric history:  In  2001, an early abortion.  On admission, the patient's vital signs were  normal.  Heart normal sinus rhythm, no murmurs.  Lungs clear to P&A.  Abdomen soft; fundal height had been 33 cm on May 03, 2004.  Fetal  heart tones were normal.  There was good  reactivity and no deceleration.  Contractions were every 2-3 minutes.  Cervix as stated earlier by the nurse  Barham.  Impressions on admission:  Intrauterine pregnancy at 37+ weeks,  early labor, positive group B strep, ruptured membranes of undetermined  length of time.  The patient was placed on IV penicillin.  She made slow but  steady progress to complete dilatation.  She pushed well and delivered  spontaneously OA over a shallow left labial laceration by Dr. Ambrose Mantle, a  living female infant, 5 pounds 15 ounces, Apgars of 9 at one and 2 at five  minutes.  At birth the baby did well.  The cord was clamped and cut by the  father.  The baby was positioned on the mother's abdomen.  When I took the  baby to the warmer at approximately 3 minutes of age the  baby was not  breathing well and lost tone.  NICU was called and they arrived at 5 minutes  of age.  The baby responded to bagged O2 but respirations remained abnormal  so he was taken to the NICU.  Placenta was intact, uterus normal, no suture  required, blood loss about 200 mL.  Venous cord pH 7.31.  The Apgars at 10  minutes were 8.  Postpartum, the patient did well and was discharged on  postpartum day #2.  The baby was taken to the NICU and the original  diagnosis was pneumonia.  The baby has done well.  It has been on room air  for 24 hours.  I think the main question now is how long to continue  antibiotics.  Hemoglobin on admission 10.6; hematocrit 31.8; white count  6900; platelet count 225,000.  Follow-up hemoglobin 9.0.  Rubella titer was  17.7.  RPR nonreactive.   FINAL DIAGNOSIS:  Intrauterine pregnancy at 37+ weeks, delivered vertex.   OPERATION:  Spontaneous delivery vertex.   FINAL CONDITION:  Improved.   Instructions include our regular discharge instruction booklet.  The patient  declines analgesics at discharge and is asked to return to the office in 6  weeks for follow-up examination.                                                Malachi Pro. Ambrose Mantle, M.D.    TFH/MEDQ  D:  05/08/2004  T:  05/08/2004  Job:  [id number]

## 2011-01-11 NOTE — Discharge Summary (Signed)
[name], POMPLUN            ACCOUNT NO.:  [id number]   MEDICAL RECORD NO.:  [id number]          PATIENT TYPE:  INP   LOCATION:  9143                          [facility]   PHYSICIAN:  Malachi Pro. Ambrose Mantle, M.D. DATE OF BIRTH:  [birth year]   DATE OF ADMISSION:  05/14/2005  DATE OF DISCHARGE:  05/17/2005                                 DISCHARGE SUMMARY   HISTORY OF PRESENT ILLNESS:  This is a 30 year old black female, para 1-0-1-  1, gravida 3, with EDC of April 24, 2005 by dates and May 10, 2005 by  an ultrasound at her first prenatal visit at 25 weeks and 5 days. Admitted  for induction of labor after a non-reactive non-stress test.  The patient's  blood group and type A positive, negative antibody.  Sickle cell negative.  RPR nonreactive.  Rubella immune.  Hepatitis B surface antigen negative.  HIV negative.  GC and Chlamydia negative.  Group B strep positive.  One-hour  Glucola 136.   The patient's prenatal course is outlined in her history and physical.  Non-  stress test on the day of admission for term assessment was nonreactive.  She was admitted for induction.   Past medical history, illnesses and family history and obstetric history all  outlined in the present illness.   HOSPITAL COURSE:  On admission, her vital signs were normal.  Cervix was 3  cm, 50%. Artificial rupture of membranes produced meconium-stained fluid.  She was started on Pitocin and penicillin for the positive group B strep.  She received an epidural after she reached 3-4 cm at 2:15 p.m.  I called the  nurse at 3:30 p.m. for an update and she had not been reexamined.  I asked  for a repeat exam and when I was called back, I was told that that Mrs.  [name] was in the operating room for a cesarean section.  I came  immediately to the hospital and in transient I talked to the operating room  and was told that Dr. Shawnie Pons had proceeded with cesarean section because the  fetal heart rate was not  detected.  She delivered the baby while I was on  the phone and the baby cried immediately.  After I had finished closing the  patient's abdominal wall, the nurse taking care of the patient spoke to me  and said that when she examined the patient, the cord fell out in her hand.  She was unable to detect the fetal heart rate in the cord.  When they got to  the operating room, still they could not detect the fetal heart rate.  Dr.  Shawnie Pons had been on the labor and delivery unit and had graciously responded  to their request for emergency assistance.   Postoperatively the patient did extremely well, had no problems.  She  ambulated well without difficulty, voided well, passed flatus and this  morning has had a bowel movement.  Her incision is healing nicely.  The  staples are left in until one week postoperatively.  At that time I will  remove the staples.   I have not  had an opportunity to ask Dr. Shawnie Pons if she uses a single or  double layered closure of the uterus.  After I learn that information, I  will inform the patient to prepare her for future deliveries since single-  layer closure is associated with a higher risk of uterine rupture during a  vaginal birth after cesarean section.   LABORATORY DATA:  The patient's initial labs showed a hemoglobin of 10.1,  hematocrit 31.7, white count 5200, platelet count 246,000. Followup  hemoglobin 9.1.  Liver function tests were normal.  RPR nonreactive.   FINAL DIAGNOSES:  1.  Intrauterine pregnancy at term, delivered by cesarean section.  2.  Non-reactive non-stress test.  3.  Prolapse umbilical cord.   OPERATION/PROCEDURE:  Low transverse cervical cesarean section.   FINAL CONDITION:  Improved.   DISCHARGE INSTRUCTIONS:  1.  Our regular discharge instruction booklet.  2.  Percocet 5/325 24 tablets one every four to six hours as needed for      pain.  3.  The patient is advised to return to see me in four days for followup       examination and staple removal.      Malachi Pro. Ambrose Mantle, M.D.  Electronically Signed     TFH/MEDQ  D:  05/17/2005  T:  05/17/2005  Job:  [id number]   cc:   Shelbie Proctor. Shawnie Pons, M.D.  Fax: [phone]

## 2011-02-26 NOTE — Unmapped (Addendum)
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      Latex Allergy: No      Betadine Allergy: No      Menses History:   [facility] Delivery: [facility]  This date is: definite  Menses monthly: yes  Frequency:  28 days  Contraception used at conception: None  Planned pregnancy: no  Pregnancy confirmed by: serum HCG  Date confirmed: 10/07/2010    Past Pregnancies:  G:  3 P:  1 A:  1  SAB: 1  EAB:    Term Deliveries:  1  Premature Deliveries:   Ectopic pregnancies:    Multiple Births:      Preg  # Date Early Loss/  Term GA Wks Lgth of Labor Brth Wt Sex Type of Del Anes Pl of Del PTL Fetal Comp Matrnl Comp Cmnts   1 04/2006  40  8 lbs 15 z F c-section -low transverse epidural [employer]  fail to progress     2 03/2008 SAB 8 weeks             3 current.               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  Patient Lives at: apartment,   Support  System: excellent  Caffeine per Day: 2  Alcohol Use: none  Drug Use: none  Tobacco Usage:smoker  Cigarettes-Packs per Day- 0.5,   Sexually Active: Yes  Total # of Sex Partners: 1            Other Pertinent History:   Pre-Pregnacy Tobacco Use per Day: 0  Pregnancy Tobacco Use per Day: 0  Pre-Pregnacy Alcohol Use per Day: 0  Pregnancy Alcohol Use per Day: 0  Pre-Pregnancy Illicit/Recreational Drug Use per Day: 0  Pregnancy Illicit/Recreational Drug Use per Day: 0  D(Rh) Sensitized: No  Latex Allergy: No  Betadine Allergy: No  Anesthetic Complications No  History of Abnormal Pap: No  Uterine Anomaly: No  History of Infertility: No  DES Exposure: No  ART Treatment: No  Infection History:   Exposed to TB: no   History of Genital Herpes: yes - partner has genital herpes  Rash or Viral Illness since LMP: no  Hepatitis B: no  Hepatitis C: no  History of STD: no  History of PID: no  Other Infection History Comments: at risk for HIv having unprotected sex , had trich at the age of 49 but was treated       Genetic Testing/Teratology Counseling:   Patient's Age>= 35 Years as of Estimated Date of Delivery: no  Thalassemia Risk: no  Neural Tube Defect: no  Congenital Heart Defect: no   Down Syndrome: no  Tay-Sachs: no  Canavan Disease: no  Familial Dysautonomia: no  Sickle Cell Disease/Trait: no  Hemophilia/Blood Disorders: no  Muscular Dystrophy: no  Cystic Fibrosis: no  Huntington's Chorea: no  Mental Retardation/Autism no  Tested for Fragile X: no  Other Genetic or Chromosomal: no  Maternal Metabolic Disorder: no  Other birth defects: no  Recurrent Pregnancy Loss/Stillbirth: no  Medications/Drugs/Alcohol since LMP: no      Clinical Issues:   Complete Problem List:   SUSPECTED FETAL ANOMALY NOT FOUND (ICD-V89.03)  VSD (ICD-745.4)  DIABETES MELLITUS, TYPE II, CONTROLLED (ICD-250.00)  DIABETES MELLITUS, GESTATIONAL, HX OF (ICD-V13.29)  PREGNANCY, HIGH RISK (ICD-V23.9)  OVERWEIGHT (ICD-278.02)  PREGNANCY, MULTIGRAVIDA  (ICD-V22.1)      Current Medications:   FREESTYLE FREEDOM LITE W/DEVICE KIT (BLOOD GLUCOSE MONITORING SUPPL) Use as directed  BLOOD GLUCOSE MONITOR SYSTEM  KIT (BLOOD GLUCOSE MONITORING SUPPL) Use as directed  LANCETS ULTRA THIN 30G  MISC (LANCETS) Use as directed        List of Diagnoses/Issues:   29yo G3P1011  Pt with concerns for her personal safety regarding the FOB        - S/p SW  (2/14) and has been crisis help line numbers.   Class B DM:        -Hx of GDM - took Glyburide, took Metformin PP       -TSH, free T4 wnl. 24hr urine 5mg . HgA1c 5.6.        -Failed OGTT 2abnormal values       -Currently on no meds. Diet only.  Hx of C-Section at [facility] for suspected macrosomia     - Desires TOLAC if natural labor prior to 39 weeks, desires RCS if not in labor by 39 weeks  Declines flu shot  Suspected VSD on [place] from 3/27   - Will need Fetal Echo at [facility] and f/up US in 4wk (approx 12/21/10)    Fetal Well  Being:   Start antenatal fetal surveillance at 32 weeks.     Plan of Care:   -No meds currently- overall ok values but need more values to fully assess  -Cont diabetic diet - encouradge compliance  -S/p SW visit  10/09/10. S/W as needed at future visits.   -get records from c/s at [facility].   - Will call [facility] with BS logs every Friday (trying to avoid missing work for appointments)  -RTC in 3 week with glucose log.  EDD:   LMP: 07/06/2010    EDD by LMP: 04/12/2011  1st [place] date: 10/03/2010 = 12  weeks, 5  days  EDD by 1st [place]: 04/12/2011  2nd [place] date: 11/20/2010 = 19  weeks, 4  days  EDD by 2nd [place]: 04/12/2011  EDD: 04/12/2011 by US2          Prenatal Exam Trends:   Pre-Pregnancy Weight: 179 lbs.  Total Weight Change:  0.30 lbs.    DATE EGA F Ht Prsnt FHR Mvmt Sgn/Sx Dil Eff BP Wt Pro Glu Edema   10/03/2010 12 5/7        / 188      10/09/2010 13 4/7 not palpable -- -- not yet noted    123/56 185.6      10/30/2010 16 4/7   140's present - not adequate    130/61 182.1 30     11/02/2010 17        /        11/06/2010 17 4/7    possible    101/57 180.9      11/27/2010 20 4/7 at umbilicus  140's active baby    100/60 179.3   --   11/28/2010 20 5/7        /                                                                                                                                                                                                                                                                                               Initial Labs:   Blood Type: a positive  D (Rh) Type: +  Rubella: immune  RPR: nonreactive  HBsAg: negative      1st Trimester Education:    2nd Trimester Education:    3rd Trimester Education:    ]

## 2011-05-20 LAB — CBC
HCT: 26.9 — ABNORMAL LOW
HCT: 28.9 — ABNORMAL LOW
HCT: 31 — ABNORMAL LOW
Hemoglobin: 10.3 — ABNORMAL LOW
Hemoglobin: 8.9 — ABNORMAL LOW
MCHC: 33.1
MCHC: 33.2
MCHC: 33.4
MCV: 85.7
MCV: 85.8
Platelets: 195
RBC: 3.13 — ABNORMAL LOW
RBC: 3.62 — ABNORMAL LOW
RDW: 17 — ABNORMAL HIGH
RDW: 17.5 — ABNORMAL HIGH
WBC: 7.2
WBC: 8.3

## 2011-05-20 LAB — RPR: RPR Ser Ql: NONREACTIVE

## 2011-08-27 DIAGNOSIS — M67439 Ganglion, unspecified wrist: Secondary | ICD-10-CM

## 2011-08-27 HISTORY — DX: Ganglion, unspecified wrist: M67.439

## 2011-09-24 ENCOUNTER — Other Ambulatory Visit: Payer: Self-pay | Admitting: Orthopedic Surgery

## 2011-09-26 ENCOUNTER — Encounter (HOSPITAL_BASED_OUTPATIENT_CLINIC_OR_DEPARTMENT_OTHER): Payer: Self-pay | Admitting: *Deleted

## 2011-10-01 ENCOUNTER — Encounter (HOSPITAL_BASED_OUTPATIENT_CLINIC_OR_DEPARTMENT_OTHER): Payer: Self-pay

## 2011-10-01 ENCOUNTER — Encounter (HOSPITAL_BASED_OUTPATIENT_CLINIC_OR_DEPARTMENT_OTHER): Payer: Self-pay | Admitting: Anesthesiology

## 2011-10-01 ENCOUNTER — Ambulatory Visit (HOSPITAL_BASED_OUTPATIENT_CLINIC_OR_DEPARTMENT_OTHER)
Admission: RE | Admit: 2011-10-01 | Discharge: 2011-10-01 | Disposition: A | Discharge status: Home / Self Care | Payer: BC Managed Care – PPO | Source: Ambulatory Visit | Attending: Orthopedic Surgery | Admitting: Orthopedic Surgery

## 2011-10-01 ENCOUNTER — Ambulatory Visit (HOSPITAL_BASED_OUTPATIENT_CLINIC_OR_DEPARTMENT_OTHER): Payer: BC Managed Care – PPO | Admitting: Anesthesiology

## 2011-10-01 ENCOUNTER — Encounter (HOSPITAL_BASED_OUTPATIENT_CLINIC_OR_DEPARTMENT_OTHER): Payer: Self-pay | Admitting: Certified Registered Nurse Anesthetist

## 2011-10-01 ENCOUNTER — Encounter (HOSPITAL_BASED_OUTPATIENT_CLINIC_OR_DEPARTMENT_OTHER)
Admission: RE | Disposition: A | Discharge status: Home / Self Care | Payer: Self-pay | Source: Ambulatory Visit | Attending: Orthopedic Surgery

## 2011-10-01 DIAGNOSIS — M6740 Ganglion, unspecified site: Secondary | ICD-10-CM | POA: Insufficient documentation

## 2011-10-01 HISTORY — PX: GANGLION CYST EXCISION: SHX1691

## 2011-10-01 HISTORY — DX: Ganglion, unspecified wrist: M67.439

## 2011-10-01 HISTORY — DX: Other reaction to spinal and lumbar puncture: G97.1

## 2011-10-01 HISTORY — DX: Other specified postprocedural states: R11.2

## 2011-10-01 HISTORY — DX: Nausea with vomiting, unspecified: Z98.890

## 2011-10-01 SURGERY — EXCISION, GANGLION CYST, WRIST
Anesthesia: General | Site: Hand | Laterality: Left | Wound class: Clean

## 2011-10-01 MED ORDER — PROPOFOL 10 MG/ML IV EMUL
INTRAVENOUS | Status: DC | PRN
  Administered 2011-10-01: 160 mg via INTRAVENOUS

## 2011-10-01 MED ORDER — LIDOCAINE HCL (CARDIAC) 20 MG/ML IV SOLN
INTRAVENOUS | Status: DC | PRN
  Administered 2011-10-01: 60 mg via INTRAVENOUS

## 2011-10-01 MED ORDER — DROPERIDOL 2.5 MG/ML IJ SOLN
INTRAMUSCULAR | Status: DC | PRN
  Administered 2011-10-01: 0.625 mg via INTRAVENOUS

## 2011-10-01 MED ORDER — MIDAZOLAM HCL 5 MG/5ML IJ SOLN
INTRAMUSCULAR | Status: DC | PRN
  Administered 2011-10-01: 2 mg via INTRAVENOUS

## 2011-10-01 MED ORDER — ONDANSETRON HCL 4 MG/2ML IJ SOLN
INTRAMUSCULAR | Status: DC | PRN
  Administered 2011-10-01: 4 mg via INTRAVENOUS

## 2011-10-01 MED ORDER — MEPERIDINE HCL 25 MG/ML IJ SOLN: 6.2500 mg | INTRAMUSCULAR | Status: DC | PRN

## 2011-10-01 MED ORDER — CHLORHEXIDINE GLUCONATE 4 % EX LIQD: 60.0000 mL | Freq: Once | CUTANEOUS | Status: DC

## 2011-10-01 MED ORDER — PROMETHAZINE HCL 25 MG/ML IJ SOLN: 6.2500 mg | INTRAMUSCULAR | Status: DC | PRN

## 2011-10-01 MED ORDER — FENTANYL CITRATE 0.05 MG/ML IJ SOLN: 25.0000 ug | INTRAMUSCULAR | Status: DC | PRN

## 2011-10-01 MED ORDER — HYDROCODONE-ACETAMINOPHEN 5-325 MG PO TABS: ORAL_TABLET | ORAL | Status: AC

## 2011-10-01 MED ORDER — FENTANYL CITRATE 0.05 MG/ML IJ SOLN
INTRAMUSCULAR | Status: DC | PRN
  Administered 2011-10-01: 25 ug via INTRAVENOUS
  Administered 2011-10-01: 100 ug via INTRAVENOUS

## 2011-10-01 MED ORDER — LACTATED RINGERS IV SOLN
INTRAVENOUS | Status: DC
  Administered 2011-10-01 (×2): via INTRAVENOUS

## 2011-10-01 MED ORDER — DEXAMETHASONE SODIUM PHOSPHATE 10 MG/ML IJ SOLN
INTRAMUSCULAR | Status: DC | PRN
  Administered 2011-10-01: 10 mg via INTRAVENOUS

## 2011-10-01 MED ORDER — LIDOCAINE HCL 2 % IJ SOLN
INTRAMUSCULAR | Status: DC | PRN
  Administered 2011-10-01: 3 mL

## 2011-10-01 SURGICAL SUPPLY — 48 items
BANDAGE ADHESIVE 1X3 (GAUZE/BANDAGES/DRESSINGS) IMPLANT
BANDAGE ELASTIC 3 VELCRO ST LF (GAUZE/BANDAGES/DRESSINGS) ×2 IMPLANT
BANDAGE GAUZE ELAST BULKY 4 IN (GAUZE/BANDAGES/DRESSINGS) IMPLANT
BLADE MINI RND TIP GREEN BEAV (BLADE) IMPLANT
BLADE SURG 15 STRL LF DISP TIS (BLADE) ×1 IMPLANT
BLADE SURG 15 STRL SS (BLADE) ×2
BNDG CMPR 9X4 STRL LF SNTH (GAUZE/BANDAGES/DRESSINGS) ×1
BNDG ESMARK 4X9 LF (GAUZE/BANDAGES/DRESSINGS) ×1 IMPLANT
BRUSH SCRUB EZ PLAIN DRY (MISCELLANEOUS) ×2 IMPLANT
CLOTH BEACON ORANGE TIMEOUT ST (SAFETY) ×2 IMPLANT
CORDS BIPOLAR (ELECTRODE) ×2 IMPLANT
COVER MAYO STAND STRL (DRAPES) ×2 IMPLANT
COVER TABLE BACK 60X90 (DRAPES) ×2 IMPLANT
CUFF TOURNIQUET SINGLE 18IN (TOURNIQUET CUFF) ×1 IMPLANT
DECANTER SPIKE VIAL GLASS SM (MISCELLANEOUS) IMPLANT
DRAPE EXTREMITY T 121X128X90 (DRAPE) ×2 IMPLANT
DRAPE SURG 17X23 STRL (DRAPES) ×2 IMPLANT
GLOVE BIO SURGEON STRL SZ7 (GLOVE) ×1 IMPLANT
GLOVE BIOGEL M STRL SZ7.5 (GLOVE) ×2 IMPLANT
GLOVE BIOGEL PI IND STRL 7.0 (GLOVE) IMPLANT
GLOVE BIOGEL PI IND STRL 7.5 (GLOVE) IMPLANT
GLOVE BIOGEL PI INDICATOR 7.0 (GLOVE) ×1
GLOVE BIOGEL PI INDICATOR 7.5 (GLOVE) ×1
GLOVE ORTHO TXT STRL SZ7.5 (GLOVE) ×2 IMPLANT
GLOVE SKINSENSE NS SZ7.0 (GLOVE) ×1
GLOVE SKINSENSE STRL SZ7.0 (GLOVE) IMPLANT
GOWN PREVENTION PLUS XLARGE (GOWN DISPOSABLE) ×4 IMPLANT
GOWN PREVENTION PLUS XXLARGE (GOWN DISPOSABLE) ×4 IMPLANT
NEEDLE 27GAX1X1/2 (NEEDLE) ×1 IMPLANT
PACK BASIN DAY SURGERY FS (CUSTOM PROCEDURE TRAY) ×2 IMPLANT
PAD CAST 3X4 CTTN HI CHSV (CAST SUPPLIES) ×1 IMPLANT
PADDING CAST ABS 4INX4YD NS (CAST SUPPLIES)
PADDING CAST ABS COTTON 4X4 ST (CAST SUPPLIES) ×1 IMPLANT
PADDING CAST COTTON 3X4 STRL (CAST SUPPLIES) ×2
SPLINT PLASTER CAST XFAST 3X15 (CAST SUPPLIES) ×4 IMPLANT
SPLINT PLASTER XTRA FASTSET 3X (CAST SUPPLIES) ×5
SPONGE GAUZE 4X4 12PLY (GAUZE/BANDAGES/DRESSINGS) ×2 IMPLANT
STOCKINETTE 4X48 STRL (DRAPES) ×2 IMPLANT
STRIP CLOSURE SKIN 1/2X4 (GAUZE/BANDAGES/DRESSINGS) ×1 IMPLANT
SUT PROLENE 3 0 PS 2 (SUTURE) ×2 IMPLANT
SUT VIC AB 4-0 P-3 18XBRD (SUTURE) ×1 IMPLANT
SUT VIC AB 4-0 P3 18 (SUTURE) ×2
SYR 3ML 23GX1 SAFETY (SYRINGE) IMPLANT
SYR CONTROL 10ML LL (SYRINGE) ×1 IMPLANT
TOWEL OR 17X24 6PK STRL BLUE (TOWEL DISPOSABLE) ×3 IMPLANT
TRAY DSU PREP LF (CUSTOM PROCEDURE TRAY) ×2 IMPLANT
UNDERPAD 30X30 INCONTINENT (UNDERPADS AND DIAPERS) ×2 IMPLANT
WATER STERILE IRR 1000ML POUR (IV SOLUTION) ×2 IMPLANT

## 2011-10-01 NOTE — Brief Op Note (Signed)
10/01/2011  9:06 AM  PATIENT:  Valentina Lucks  31 y.o. female  PRE-OPERATIVE DIAGNOSIS:  Left dorsal wrist myxoid cyst  POST-OPERATIVE DIAGNOSIS:  Left dorsal wrist myxoid cyst  PROCEDURE:  Procedure(s): REMOVAL COMPLEX MYXOID CYST DORSUM OF WRIST AND LONG CARPOMETACARPAL JOINT  SURGEON:  Surgeon(s): Wyn Forster., MD  PHYSICIAN ASSISTANT:   ASSISTANTS: Mallory Shirk.A-C   ANESTHESIA:   none  EBL:  Total I/O In: 1000 [I.V.:1000] Out: -   BLOOD ADMINISTERED:none  DRAINS: none   LOCAL MEDICATIONS USED:  LIDOCAINE 3 CC 2 %  SPECIMEN:  No Specimen  DISPOSITION OF SPECIMEN:  N/A  COUNTS:  YES  TOURNIQUET:  * Missing tourniquet times found for documented tourniquets in log:  20333 *  DICTATION: .Other Dictation: Dictation Number [id number]  PLAN OF CARE: Discharge to home after PACU  PATIENT DISPOSITION:  PACU - hemodynamically stable.

## 2011-10-01 NOTE — Anesthesia Preprocedure Evaluation (Signed)
Anesthesia Evaluation  Patient identified by MRN, date of birth, ID band Patient awake    Reviewed: Allergy & Precautions, H&P , NPO status , Patient's Chart, lab work & pertinent test results  History of Anesthesia Complications (+) PONV  Airway Mallampati: I TM Distance: >3 FB Neck ROM: Full    Dental No notable dental hx. (+) Teeth Intact   Pulmonary neg pulmonary ROS,  clear to auscultation  Pulmonary exam normal       Cardiovascular neg cardio ROS Regular Normal    Neuro/Psych Negative Neurological ROS  Negative Psych ROS   GI/Hepatic negative GI ROS, Neg liver ROS,   Endo/Other  Negative Endocrine ROS  Renal/GU negative Renal ROS  Genitourinary negative   Musculoskeletal   Abdominal   Peds  Hematology negative hematology ROS (+)   Anesthesia Other Findings   Reproductive/Obstetrics negative OB ROS                           Anesthesia Physical Anesthesia Plan  ASA: I  Anesthesia Plan: General   Post-op Pain Management:    Induction: Intravenous  Airway Management Planned: LMA  Additional Equipment:   Intra-op Plan:   Post-operative Plan: Extubation in OR  Informed Consent: I have reviewed the patients History and Physical, chart, labs and discussed the procedure including the risks, benefits and alternatives for the proposed anesthesia with the patient or authorized representative who has indicated his/her understanding and acceptance.     Plan Discussed with: CRNA  Anesthesia Plan Comments:         Anesthesia Quick Evaluation

## 2011-10-01 NOTE — Op Note (Signed)
OP NOTE DICTATED 10/01/11 [id number]

## 2011-10-01 NOTE — Op Note (Signed)
[name]            ACCOUNT NO.:  [id number]  MEDICAL RECORD NO.:  [id number]  LOCATION:                                 FACILITY:  PHYSICIAN:  Katy Fitch. Rosely Fernandez, M.D.      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  POSTOPERATIVE DIAGNOSIS:  Complicated myxoid cyst, possibly originating from carpometacarpal joint of long finger versus traditional myxoid cyst emanating from region of dorsal carpal arch and scapholunate ligament.  OPERATION:  Dissection and removal of a complex myxoid cyst with adherence to the carpometacarpal joint of the long finger, adherence to the dorsal carpal arch, and extension towards the scaphoid capitate articulation.  OPERATING SURGEON:  Katy Fitch. Laquincy Eastridge, M.D.  ASSISTANT:  Marveen Reeks. Dasnoit, PA-C.  ANESTHESIA:  General by LMA.  SUPERVISING ANESTHESIOLOGIST:  Janetta Hora. Gelene Mink, M.D.  INDICATIONS:  [name] is a 31 year old woman referred through the courtesy of Dr. Henrine Screws of [facility] for evaluation and management of a large tense myxoid cyst on the dorsal aspect of her left wrist.  This has been present since high school days.  She noted discomfort with full palmar flexion of the wrist, full dorsiflexion of the wrist or push-up position.  She tolerated this for quite a while and ultimately decided after seeing Dr. Abigail Miyamoto for an annual physical to proceed with excision.  She was referred for examination in the office.  Clinical examination revealed a myxoid cyst that was centered over the capitate region. Typically, these will originate from the scapholunate ligament, but some will come from the region of the carpometacarpal joint of the long finger.  [name] had a minimal carpal boss i.e. an osteophyte forming at the base of long finger metacarpal  where it articulates with the capitate.  We advised her to either live with this or if it was bothersome to her to have this cyst excised to relieve her mechanical symptoms.  We explained her in the office that the pathophysiology of this myxoid cyst was very poorly understood.  They will often grow on the dorsal ligament structures of the joint.  They will grow adjacent to a osteophyte at the carpometacarpal joints and at times, they will grow directly in the wall of a blood vessel.  We advised her that it was reasonable to remove the cyst if she had significant symptoms.  She understood that there was always a small chance of recurrence.  For anyone individual, the recurrence is either 0 or 100%.  Questions regarding the anticipated procedure were invited and answered in detail.  Preoperatively, she was re-interviewed in the holding area.  Questions were invited and answered.  After routine surgical site identification, she is brought to the operating room at this time.  PROCEDURE:  [name] was brought to room 2 of the [facility] and placed in supine position on the operating table.  Under Dr. Thornton Dales direct supervision, general anesthesia by LMA technique was induced.  The left arm was prepped with Betadine soap and solution, sterilely draped.  A  pneumatic tourniquet was applied to the proximal brachium. IV prophylactic antibiotics were deferred.  Following exsanguination of the left arm with Esmarch bandage, arterial tourniquet was inflated to 220 mmHg.  Following a routine surgical time- out, procedure commenced with a 2-cm transverse incision directly over the mass.  Subcutaneous tissues were carefully divided, taken care to gently retract the radial superficial sensory branches.  The extensor retinaculum was expanded and split in line of its fibers.  We meticulously dissected down to the membrane of the cyst.  We placed a blunt Ragnell retractors  as well as Jomarie Longs skin hooks.  The cyst was quite tense, measuring nearly 1.5 cm diameter.  This was drained of about 90% of its contents and a right-angle hemostat was used to maintain pressure within the cyst to allow identification of its margins.  We carefully dissected the cyst off of the extensor carpi radialis brevis.  The carpometacarpal joint of the long finger, there was a small osteophyte at the base of long finger, but no obvious sinus tract.  This involved the dorsal carpal arch.  The cyst was carefully dissected off the wall of the dorsal arch.  It extended down proximally along the capitate to the capitate scaphoid articulation.  I could not discern a discrete stalk to the scapholunate ligament.  All things considered, it appeared that the site of origin was either one of the walls of the veins of the dorsal carpal arch or the carpometacarpal joint.  Each of these sites were clearly dissected and cleared of all soft tissue with a micro-rongeur.  We then used a bipolar cautery to electrodesiccate the tissues along the capsular surfaces.  Thereafter, the wound was repaired with layered closure with 4-0 Vicryl in the retinaculum followed by subcutaneous 4-0 Vicryl and intradermal 3- 0 Prolene.  A compressive dressing was applied.  A volar plaster splint was fashioned maintaining the wrist in 10 degrees of dorsiflexion.  For aftercare, [name] is provided prescriptions for hydrocodone 5 mg 1 p.o. q.4-6 hours p.r.n. pain, 20 tablets without refill.  She is advised to return to our office for followup in a week for dressing change, suture removal, and advancement to a postoperative exercise program.     Katy Fitch. Cason Dabney, M.D.     RVS/MEDQ  D:  10/01/2011  T:  10/01/2011  Job:  [id number]  cc:   Chales Salmon. Abigail Miyamoto, M.D.

## 2011-10-01 NOTE — Anesthesia Procedure Notes (Signed)
Procedure Name: LMA Insertion Date/Time: 10/01/2011 8:34 AM Performed by: Saketh Daubert D Pre-anesthesia Checklist: Patient identified, Emergency Drugs available, Suction available and Patient being monitored Patient Re-evaluated:Patient Re-evaluated prior to inductionOxygen Delivery Method: Circle System Utilized Preoxygenation: Pre-oxygenation with 100% oxygen Intubation Type: IV induction Ventilation: Mask ventilation without difficulty LMA: LMA inserted LMA Size: 4.0 Number of attempts: 1 Placement Confirmation: positive ETCO2 Tube secured with: Tape Dental Injury: Teeth and Oropharynx as per pre-operative assessment

## 2011-10-01 NOTE — Transfer of Care (Signed)
Immediate Anesthesia Transfer of Care Note  Patient: [name]  Procedure(s) Performed:  REMOVAL GANGLION OF WRIST - left wrist  Patient Location: PACU  Anesthesia Type: General  Level of Consciousness: sedated  Airway & Oxygen Therapy: Patient Spontanous Breathing and Patient connected to face mask oxygen  Post-op Assessment: Report given to PACU RN and Post -op Vital signs reviewed and stable  Post vital signs: Reviewed and stable  Complications: No apparent anesthesia complications

## 2011-10-01 NOTE — H&P (Signed)
  [name] is an 31 y.o. female.   Chief Complaint: Complaining of dorsal wrist mass left wrist HPI: . 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 She presents for evaluation of a mass that has been present for years but recently has been increasing in size. She now has pain when she dorsiflexes her wrist or puts weight on her hand in a push up position. She also has pain with extreme palmar flexion.   Past Medical History  Diagnosis Date  . PONV (postoperative nausea and vomiting)   . Spinal headache   . Ganglion cyst of wrist 08/2011    left  . Asthma     as a child - no current med. or prob.    Past Surgical History  Procedure Date  . Cesarean section 05/14/2005; 11/19/2007    History reviewed. No pertinent family history. Social History:  reports that she has never smoked. She has never used smokeless tobacco. She reports that she drinks alcohol. She reports that she does not use illicit drugs.  Allergies: No Known Allergies  No current facility-administered medications on file as of .   No current outpatient prescriptions on file as of .    No results found for this or any previous visit (from the past 48 hour(s)).  No results found.   Pertinent items are noted in HPI.  Height 5\' 3"  (1.6 m), weight 63.504 kg (140 lb), last menstrual period 08/29/2011.  General appearance: alert Head: Normocephalic, without obvious abnormality Neck: supple, symmetrical, trachea midline Resp: clear to auscultation bilaterally Cardio: regular rate and rhythm, S1, S2 normal, no murmur, click, rub or gallop GI: normal findings: bowel sounds normal Extremities: . Inspection of her hands and wrists reveals a 1.5 cm in diameter dorsal cystic mass consistent with a myxoid cyst associated with the scapholunate ligament. This does not move with finger motion. She has full ROM of her wrist and fingers. Her pulses and capillary refill are intact. She has had  some mild numbness consistent with carpal tunnel syndrome. She has had no electrodiagnostic studies to date. She has no sign of stenosing tenosynovitis.   X-rays of her wrist AP, lateral demonstrates normal bony anatomy.   Pulses: 2+ and symmetric Skin: normal Neurologic: Grossly normal    Assessment/Plan Impression: Myxoid type cyst dorsum left wrist  Plan: Patient to be taken to the operating room to undergo excisional biopsy of mass dorsum left wrist. The procedure risks benefits and postoperative course were discussed at length with the patient she was in agreement with this plan.  DASNOIT,Shamarr Faucett J 10/01/2011, 7:23 AM   H&P documentation: 10/01/2011  -History and Physical Reviewed  -Patient has been re-examined  -No change in the plan of care  Wyn Forster, MD

## 2011-10-01 NOTE — Anesthesia Postprocedure Evaluation (Signed)
  Anesthesia Post-op Note  Patient: [name]  Procedure(s) Performed:  REMOVAL GANGLION OF WRIST - left wrist  Patient Location: PACU  Anesthesia Type: General  Level of Consciousness: awake  Airway and Oxygen Therapy: Patient Spontanous Breathing and Patient connected to face mask oxygen  Post-op Pain: none  Post-op Assessment: Post-op Vital signs reviewed, Patient's Cardiovascular Status Stable, Respiratory Function Stable, Patent Airway and No signs of Nausea or vomiting  Post-op Vital Signs: Reviewed and stable  Complications: No apparent anesthesia complications

## 2011-10-02 ENCOUNTER — Encounter (HOSPITAL_BASED_OUTPATIENT_CLINIC_OR_DEPARTMENT_OTHER): Payer: Self-pay | Admitting: Orthopedic Surgery

## 2012-02-05 ENCOUNTER — Emergency Department (HOSPITAL_COMMUNITY)
Admission: EM | Admit: 2012-02-05 | Discharge: 2012-02-05 | Disposition: A | Discharge status: Home / Self Care | Payer: BC Managed Care – PPO | Source: Home / Self Care | Attending: Family Medicine | Admitting: Family Medicine

## 2012-02-05 ENCOUNTER — Encounter (HOSPITAL_COMMUNITY): Payer: Self-pay | Admitting: *Deleted

## 2012-02-05 DIAGNOSIS — S93529A Sprain of metatarsophalangeal joint of unspecified toe(s), initial encounter: Secondary | ICD-10-CM

## 2012-02-05 HISTORY — DX: Other seasonal allergic rhinitis: J30.2

## 2012-02-05 MED ORDER — IBUPROFEN 600 MG PO TABS: 600.0000 mg | ORAL_TABLET | Freq: Three times a day (TID) | ORAL | Status: AC

## 2012-02-05 MED ORDER — TRAMADOL HCL 50 MG PO TABS: 50.0000 mg | ORAL_TABLET | Freq: Four times a day (QID) | ORAL | Status: AC | PRN

## 2012-02-05 NOTE — ED Notes (Signed)
Pt with c/o left lower leg and foot pain onset x one week - denies injury

## 2012-02-05 NOTE — Discharge Instructions (Signed)
Use rigid shoe until pain resolves. Removed for stretching exercises at least 3 times a day as soon as pain improves.  Foot Sprain The muscles and cord like structures which attach muscle to bone (tendons) that surround the feet are made up of units. A foot sprain can occur at the weakest spot in any of these units. This condition is most often caused by injury to or overuse of the foot, as from playing contact sports, or aggravating a previous injury, or from poor conditioning, or obesity. SYMPTOMS  Pain with movement of the foot.   Tenderness and swelling at the injury site.   Loss of strength is present in moderate or severe sprains.  THE THREE GRADES OR SEVERITY OF FOOT SPRAIN ARE:  Mild (Grade I): Slightly pulled muscle without tearing of muscle or tendon fibers or loss of strength.   Moderate (Grade II): Tearing of fibers in a muscle, tendon, or at the attachment to bone, with small decrease in strength.   Severe (Grade III): Rupture of the muscle-tendon-bone attachment, with separation of fibers. Severe sprain requires surgical repair. Often repeating (chronic) sprains are caused by overuse. Sudden (acute) sprains are caused by direct injury or over-use.  DIAGNOSIS  Diagnosis of this condition is usually by your own observation. If problems continue, a caregiver may be required for further evaluation and treatment. X-rays may be required to make sure there are not breaks in the bones (fractures) present. Continued problems may require physical therapy for treatment. PREVENTION  Use strength and conditioning exercises appropriate for your sport.   Warm up properly prior to working out.   Use athletic shoes that are made for the sport you are participating in.   Allow adequate time for healing. Early return to activities makes repeat injury more likely, and can lead to an unstable arthritic foot that can result in prolonged disability. Mild sprains generally heal in 3 to 10 days,  with moderate and severe sprains taking 2 to 10 weeks. Your caregiver can help you determine the proper time required for healing.  HOME CARE INSTRUCTIONS   Apply ice to the injury for 15 to 20 minutes, 3 to 4 times per day. Put the ice in a plastic bag and place a towel between the bag of ice and your skin.   An elastic wrap (like an Ace bandage) may be used to keep swelling down.   Keep foot above the level of the heart, or at least raised on a footstool, when swelling and pain are present.   Try to avoid use other than gentle range of motion while the foot is painful. Do not resume use until instructed by your caregiver. Then begin use gradually, not increasing use to the point of pain. If pain does develop, decrease use and continue the above measures, gradually increasing activities that do not cause discomfort, until you gradually achieve normal use.   Use crutches if and as instructed, and for the length of time instructed.   Keep injured foot and ankle wrapped between treatments.   Massage foot and ankle for comfort and to keep swelling down. Massage from the toes up towards the knee.   Only take over-the-counter or prescription medicines for pain, discomfort, or fever as directed by your caregiver.  SEEK IMMEDIATE MEDICAL CARE IF:   Your pain and swelling increase, or pain is not controlled with medications.   You have loss of feeling in your foot or your foot turns cold or blue.   You  develop new, unexplained symptoms, or an increase of the symptoms that brought you to your caregiver.  MAKE SURE YOU:   Understand these instructions.   Will watch your condition.   Will get help right away if you are not doing well or get worse.  Document Released: 02/01/2002 Document Revised: 08/01/2011 Document Reviewed: 03/31/2008 [facility] Patient Information [address], [place].

## 2012-02-05 NOTE — ED Notes (Signed)
Psyche assessment documented by karen fowler, rn not pertaining to this patient

## 2012-02-08 NOTE — ED Provider Notes (Signed)
History     CSN: [id number]  Arrival date & time 02/05/12  1827   First MD Initiated Contact with Patient 02/05/12 1831      Chief Complaint  Patient presents with  . Foot Pain  . Leg Pain  . Foot Swelling    (Consider location/radiation/quality/duration/timing/severity/associated sxs/prior treatment) HPI Comments: 31 y/o female here c/o 1 week with pain at left foot and lower leg. 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 She has used heeled shoes before started with pain. Otherwise no known injury. No recent falls or foot twisting episodes. With putting weight on tender area and flexing her foot. No swelling or bruising. No other complaints.   Patient is a 31 y.o. female presenting with leg pain.  Leg Pain     Past Medical History  Diagnosis Date  . PONV (postoperative nausea and vomiting)   . Spinal headache   . Ganglion cyst of wrist 08/2011    left  . Asthma     as a child - no current med. or prob.  . Seasonal allergies     Past Surgical History  Procedure Date  . Cesarean section 05/14/2005; 11/19/2007  . Ganglion cyst excision 10/01/2011    Procedure: REMOVAL GANGLION OF WRIST;  Surgeon: Wyn Forster., MD;  Location: [facility];  Service: Orthopedics;  Laterality: Left;  left wrist    History reviewed. No pertinent family history.  History  Substance Use Topics  . Smoking status: Never Smoker   . Smokeless tobacco: Never Used  . Alcohol Use: Yes     occasionally    OB History    Grav Para Term Preterm Abortions TAB SAB Ect Mult Living                  Review of Systems  Constitutional:       10 systems reviewed and  pertinent negative and positive symptoms are as per HPI.     Musculoskeletal:       Left foot pain as per HPI.  All other systems reviewed and are negative.    Allergies  Review of patient's allergies indicates no known allergies.  Home Medications   Current Outpatient Rx  Name Route Sig  Dispense Refill  . IBUPROFEN 600 MG PO TABS Oral Take 1 tablet (600 mg total) by mouth 3 (three) times daily. 21 tablet 0  . TRAMADOL HCL 50 MG PO TABS Oral Take 1 tablet (50 mg total) by mouth every 6 (six) hours as needed for pain. 15 tablet 0    BP 110/73  Pulse 77  Temp 98.3 F (36.8 C) (Oral)  Resp 15  SpO2 99%  LMP 01/20/2012  Physical Exam  Nursing note and vitals reviewed. Constitutional: She is oriented to person, place, and time. She appears well-developed and well-nourished. No distress.  HENT:  Head: Normocephalic and atraumatic.  Cardiovascular: Normal heart sounds.   Pulmonary/Chest: Breath sounds normal.  Musculoskeletal:       Diffused pain and tenderness over left 2nd metatarsal joint in dorsum and sole area. No hematoma or swelling. No distal toe cyanosis or bruising. FROM despite reported MPJ tenderness. Pain worse with foot flexion and extension and with walking on tiptoes.  Normal dorsal pedis and tibial posterior pulses.  Intact superficial sensation.  Neurological: She is alert and oriented to person, place, and time.    ED Course  Procedures (including critical care time)  Labs Reviewed - No data  to display No results found.   1. Sprain of metatarsophalangeal (joint) of foot       MDM  Rigid sole shoe placed. Prescribed ibuprofen and tramadol. Orthopedic referral for follow up as needed.         Sharin Grave, MD 02/08/12 1521

## 2012-04-28 ENCOUNTER — Inpatient Hospital Stay
Admit: 2012-04-28 | Discharge: 2012-04-28 | Disposition: A | Discharge status: Home / Self Care | Payer: PRIVATE HEALTH INSURANCE

## 2012-04-28 DIAGNOSIS — Z113 Encounter for screening for infections with a predominantly sexual mode of transmission: Secondary | ICD-10-CM

## 2012-04-28 LAB — HCG URINE, QUALITATIVE: Preg Test, Ur: NEGATIVE

## 2012-04-28 MED ORDER — metroNIDAZOLE (FLAGYL) tablet 2,000 mg
500 | Freq: Once | ORAL | Status: AC
Start: 2012-04-28 — End: 2012-04-28
  Administered 2012-04-28: 16:00:00 via ORAL

## 2012-04-28 MED ORDER — azithromycin (ZITHROMAX) tablet 1,000 mg
250 | Freq: Once | ORAL | Status: AC
Start: 2012-04-28 — End: 2012-04-28
  Administered 2012-04-28: 16:00:00 via ORAL

## 2012-04-28 MED ORDER — cefixime (SUPRAX) tablet 400 mg
400 | Freq: Once | ORAL | Status: AC
Start: 2012-04-28 — End: 2012-04-28
  Administered 2012-04-28: 16:00:00 via ORAL

## 2012-04-28 MED ORDER — levonorgestrel (PLAN B ONE-STEP) tablet Tab 1.5 mg
1.5 | Freq: Once | ORAL | Status: AC
Start: 2012-04-28 — End: 2012-04-28
  Administered 2012-04-28: 16:00:00 via ORAL

## 2012-04-28 MED FILL — PLAN B ONE-STEP 1.5 MG TABLET: 1.5 1.5 mg | ORAL | Qty: 1

## 2012-04-28 MED FILL — AZITHROMYCIN 250 MG TABLET: 250 250 MG | ORAL | Qty: 4

## 2012-04-28 MED FILL — SUPRAX 400 MG TABLET: 400 400 mg | ORAL | Qty: 1

## 2012-04-28 MED FILL — METRONIDAZOLE 500 MG TABLET: 500 500 MG | ORAL | Qty: 4
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
## 2012-04-28 NOTE — Unmapped (Signed)
Discharged home.

## 2012-04-28 NOTE — Unmapped (Signed)
Ashlee Garcia was seen by the SANE nurse for exam and treated per our protocol for STI prophylaxis and plan B. I received verbal report from RN Hinckle, with no further concerns reported.     Anderson Malta, MD  Resident  04/28/12 [phone]

## 2012-04-28 NOTE — Unmapped (Signed)
Introduced self to pt. Discussed HIV and STI prophylaxis. Hepatitis B and Plan B. At this time pt states she was allergic to PCN when very young but has taken amoxicillin since with no difficulty. At this time wants plan B and STI prophylaxis.

## 2013-06-09 ENCOUNTER — Emergency Department (HOSPITAL_COMMUNITY)
Admission: EM | Admit: 2013-06-09 | Discharge: 2013-06-10 | Disposition: A | Discharge status: Home / Self Care | Payer: BC Managed Care – PPO | Attending: Emergency Medicine | Admitting: Emergency Medicine

## 2013-06-09 ENCOUNTER — Encounter (HOSPITAL_COMMUNITY): Payer: Self-pay | Admitting: Emergency Medicine

## 2013-06-09 ENCOUNTER — Emergency Department (HOSPITAL_COMMUNITY): Payer: BC Managed Care – PPO

## 2013-06-09 DIAGNOSIS — Z8669 Personal history of other diseases of the nervous system and sense organs: Secondary | ICD-10-CM | POA: Insufficient documentation

## 2013-06-09 DIAGNOSIS — Z8739 Personal history of other diseases of the musculoskeletal system and connective tissue: Secondary | ICD-10-CM | POA: Insufficient documentation

## 2013-06-09 DIAGNOSIS — R0789 Other chest pain: Secondary | ICD-10-CM

## 2013-06-09 DIAGNOSIS — R071 Chest pain on breathing: Secondary | ICD-10-CM | POA: Insufficient documentation

## 2013-06-09 DIAGNOSIS — J45909 Unspecified asthma, uncomplicated: Secondary | ICD-10-CM | POA: Insufficient documentation

## 2013-06-09 LAB — BASIC METABOLIC PANEL
BUN: 13 mg/dL (ref 6–23)
Calcium: 9.4 mg/dL (ref 8.4–10.5)
Creatinine, Ser: 0.75 mg/dL (ref 0.50–1.10)
GFR calc non Af Amer: >90 mL/min (ref >90)
Glucose, Bld: 103 mg/dL — ABNORMAL HIGH (ref 70–99)
Sodium: 139 mEq/L (ref 135–145)

## 2013-06-09 LAB — POCT I-STAT TROPONIN I: Troponin i, poc: 0 ng/mL (ref 0.00–0.08)

## 2013-06-09 LAB — CBC
HCT: 37.8 % (ref 36.0–46.0)
Hemoglobin: 12.7 g/dL (ref 12.0–15.0)
MCHC: 33.6 g/dL (ref 30.0–36.0)
MCV: 88.3 fL (ref 78.0–100.0)
Platelets: 224 10*3/uL (ref 150–400)
RDW: 13.5 % (ref 11.5–15.5)

## 2013-06-09 NOTE — ED Notes (Signed)
Pt reports chest pain for two days, made worse with deep breathing, coughing and palpation.

## 2013-06-09 NOTE — ED Notes (Signed)
Patient c/o chest pain x2 days, that gets worse when she breathes deeply

## 2013-06-09 NOTE — ED Provider Notes (Signed)
CSN: [id number]     Arrival date & time 06/09/13  1752 History   First MD Initiated Contact with Patient 06/09/13 2307     Chief Complaint  Patient presents with  . Chest Pain   (Consider location/radiation/quality/duration/timing/severity/associated sxs/prior Treatment) Patient is a 32 y.o. female presenting with chest pain. The history is provided by the patient.  Chest Pain Pain location:  Substernal area Pain quality: dull   Pain radiates to:  Does not radiate Pain radiates to the back: no   Pain severity:  Moderate Onset quality:  Gradual Duration:  2 days Timing:  Constant Progression:  Unchanged Chronicity:  New Context: not lifting   Relieved by:  Nothing Worsened by:  Nothing tried Ineffective treatments:  None tried Associated symptoms: no abdominal pain and no shortness of breath   Risk factors: no aortic disease     Past Medical History  Diagnosis Date  . PONV (postoperative nausea and vomiting)   . Spinal headache   . Ganglion cyst of wrist 08/2011    left  . Asthma     as a child - no current med. or prob.  . Seasonal allergies    Past Surgical History  Procedure Laterality Date  . Cesarean section  05/14/2005; 11/19/2007  . Ganglion cyst excision  10/01/2011    Procedure: REMOVAL GANGLION OF WRIST;  Surgeon: Wyn Forster., MD;  Location: [facility];  Service: Orthopedics;  Laterality: Left;  left wrist   History reviewed. No pertinent family history. History  Substance Use Topics  . Smoking status: Never Smoker   . Smokeless tobacco: Never Used  . Alcohol Use: Yes     Comment: occasionally   OB History   Grav Para Term Preterm Abortions TAB SAB Ect Mult Living                 Review of Systems  Respiratory: Negative for shortness of breath.   Cardiovascular: Positive for chest pain.  Gastrointestinal: Negative for abdominal pain.  All other systems reviewed and are negative.    Allergies  Review of patient's allergies  indicates no known allergies.  Home Medications  No current outpatient prescriptions on file. BP 122/79  Pulse 70  Temp(Src) 98.3 F (36.8 C) (Oral)  Resp 16  Ht 5\' 3"  (1.6 m)  Wt 148 lb (67.132 kg)  BMI 26.22 kg/m2  SpO2 100%  LMP 06/02/2013 Physical Exam  Constitutional: She is oriented to person, place, and time. She appears well-developed and well-nourished. No distress.  HENT:  Head: Normocephalic and atraumatic.  Mouth/Throat: Oropharynx is clear and moist.  Eyes: Conjunctivae are normal. Pupils are equal, round, and reactive to light.  Neck: Normal range of motion. Neck supple.  Cardiovascular: Normal rate, regular rhythm and intact distal pulses.   Pulmonary/Chest: Effort normal and breath sounds normal. She has no wheezes. She has no rales. She exhibits tenderness.  Abdominal: Soft. Bowel sounds are normal. There is no tenderness. There is no rebound and no guarding.  Musculoskeletal: Normal range of motion.  Neurological: She is alert and oriented to person, place, and time.  Skin: Skin is warm and dry.  Psychiatric: She has a normal mood and affect.    ED Course  Procedures (including critical care time) Labs Review Labs Reviewed  BASIC METABOLIC PANEL - Abnormal; Notable for the following:    Glucose, Bld 103 (*)    All other components within normal limits  CBC  PRO B NATRIURETIC PEPTIDE  D-DIMER, QUANTITATIVE  POCT I-STAT TROPONIN I   Imaging Review Dg Chest 2 View  06/09/2013   CLINICAL DATA:  Chest pain  EXAM: CHEST  2 VIEW  COMPARISON:  None.  FINDINGS: The lungs are clear. Heart size and pulmonary vascularity are normal. No adenopathy. No pneumothorax. No bone lesions.  IMPRESSION: No abnormality noted.   Electronically Signed   By: Bretta Bang M.D.   On: 06/09/2013 18:51    EKG Interpretation   None       MDM  No diagnosis found.  Date: 06/09/2013  Rate: 82  Rhythm: normal sinus rhythm  QRS Axis: normal  Intervals: normal  ST/T  Wave abnormalities: normal  Conduction Disutrbances: none  Narrative Interpretation: unremarkable      In the setting of ongoing pain > 8 hours duration with negative ekg and troponin acs is excluded sx consistent with MSK pain  Kayron Hicklin K Jazzmyn Filion-Rasch, MD 06/10/13 0040

## 2013-06-10 MED ORDER — KETOROLAC TROMETHAMINE 60 MG/2ML IM SOLN
60.0000 mg | Freq: Once | INTRAMUSCULAR | Status: AC
  Administered 2013-06-10: 60 mg via INTRAMUSCULAR
  Filled 2013-06-10: qty 2

## 2013-06-10 MED ORDER — TRAMADOL HCL 50 MG PO TABS: 50.0000 mg | ORAL_TABLET | Freq: Four times a day (QID) | ORAL | Status: DC | PRN

## 2013-06-10 MED ORDER — IBUPROFEN 600 MG PO TABS: 600.0000 mg | ORAL_TABLET | Freq: Four times a day (QID) | ORAL | Status: DC | PRN

## 2013-06-10 NOTE — ED Notes (Signed)
Pt ambulating independently w/ steady gait on d/c in no acute distress, A&Ox4. D/c instructions reviewed w/ pt, pt denies any further questions or concerns at present. Rx given x2

## 2013-10-10 ENCOUNTER — Emergency Department (HOSPITAL_COMMUNITY)
Admission: EM | Admit: 2013-10-10 | Discharge: 2013-10-10 | Disposition: A | Discharge status: Home / Self Care | Payer: BC Managed Care – PPO | Source: Home / Self Care

## 2013-10-10 ENCOUNTER — Encounter (HOSPITAL_COMMUNITY): Payer: Self-pay | Admitting: Emergency Medicine

## 2013-10-10 DIAGNOSIS — N390 Urinary tract infection, site not specified: Secondary | ICD-10-CM

## 2013-10-10 LAB — POCT URINALYSIS DIP (DEVICE)
Glucose, UA: 500 mg/dL — AB
Ketones, ur: 15 mg/dL — AB
NITRITE: POSITIVE — AB
Protein, ur: >=300 mg/dL — AB
Specific Gravity, Urine: <=1.005 (ref 1.005–1.030)
pH: 5 (ref 5.0–8.0)

## 2013-10-10 LAB — POCT PREGNANCY, URINE: Preg Test, Ur: NEGATIVE

## 2013-10-10 MED ORDER — CEPHALEXIN 500 MG PO CAPS: 500.0000 mg | ORAL_CAPSULE | Freq: Four times a day (QID) | ORAL | Status: DC

## 2013-10-10 NOTE — ED Notes (Signed)
Assessment per Dr. Kindl. 

## 2013-10-10 NOTE — ED Provider Notes (Signed)
CSN: [id number]     Arrival date & time 10/10/13  1019 History   None    No chief complaint on file.    (Consider location/radiation/quality/duration/timing/severity/associated sxs/prior Treatment) Patient is a 33 y.o. female presenting with dysuria. The history is provided by the patient.  Dysuria Pain quality:  Burning Pain severity:  Mild Onset quality:  Sudden Duration:  1 day Progression:  Worsening Chronicity:  New Recent urinary tract infections: no   Relieved by:  None tried Worsened by:  Nothing tried Ineffective treatments:  None tried Urinary symptoms: discolored urine and frequent urination   Associated symptoms: no fever, no flank pain, no nausea, no vaginal discharge and no vomiting   Risk factors: sexually active     Past Medical History  Diagnosis Date  . PONV (postoperative nausea and vomiting)   . Spinal headache   . Ganglion cyst of wrist 08/2011    left  . Asthma     as a child - no current med. or prob.  . Seasonal allergies    Past Surgical History  Procedure Laterality Date  . Cesarean section  05/14/2005; 11/19/2007  . Ganglion cyst excision  10/01/2011    Procedure: REMOVAL GANGLION OF WRIST;  Surgeon: Cammie Sickle., MD;  Location: [facility];  Service: Orthopedics;  Laterality: Left;  left wrist   No family history on file. History  Substance Use Topics  . Smoking status: Never Smoker   . Smokeless tobacco: Never Used  . Alcohol Use: Yes     Comment: occasionally   OB History   Grav Para Term Preterm Abortions TAB SAB Ect Mult Living                 Review of Systems  Constitutional: Negative.  Negative for fever.  Gastrointestinal: Negative for nausea and vomiting.  Genitourinary: Positive for dysuria, urgency and frequency. Negative for flank pain, vaginal discharge, menstrual problem and pelvic pain.      Allergies  Review of patient's allergies indicates no known allergies.  Home Medications   Current  Outpatient Rx  Name  Route  Sig  Dispense  Refill  . cephALEXin (KEFLEX) 500 MG capsule   Oral   Take 1 capsule (500 mg total) by mouth 4 (four) times daily. Take all of medicine and drink lots of fluids   20 capsule   0   . ibuprofen (ADVIL,MOTRIN) 600 MG tablet   Oral   Take 1 tablet (600 mg total) by mouth every 6 (six) hours as needed for pain.   30 tablet   0   . traMADol (ULTRAM) 50 MG tablet   Oral   Take 1 tablet (50 mg total) by mouth every 6 (six) hours as needed for pain.   15 tablet   0    There were no vitals taken for this visit. Physical Exam  Nursing note and vitals reviewed. Constitutional: She is oriented to person, place, and time. She appears well-developed and well-nourished.  Abdominal: Soft. Bowel sounds are normal. There is tenderness in the suprapubic area. There is no CVA tenderness.  Neurological: She is alert and oriented to person, place, and time.  Skin: Skin is warm and dry.    ED Course  Procedures (including critical care time) Labs Review Labs Reviewed  POCT URINALYSIS DIP (DEVICE) - Abnormal; Notable for the following:    Glucose, UA 500 (*)    Bilirubin Urine LARGE (*)    Ketones, ur 15 (*)  Hgb urine dipstick LARGE (*)    Protein, ur >=300 (*)    Nitrite POSITIVE (*)    Leukocytes, UA LARGE (*)    All other components within normal limits  POCT PREGNANCY, URINE   Imaging Review No results found.    MDM   Final diagnoses:  UTI (lower urinary tract infection)        Billy Fischer, MD 10/10/13 1115

## 2013-10-10 NOTE — Discharge Instructions (Signed)
Take all of medicine as directed, drink lots of fluids, see your doctor if further problems. °

## 2013-11-04 ENCOUNTER — Ambulatory Visit: Admit: 2013-11-04 | Payer: PRIVATE HEALTH INSURANCE

## 2013-11-04 ENCOUNTER — Inpatient Hospital Stay: Admit: 2013-11-04 | Payer: PRIVATE HEALTH INSURANCE

## 2013-11-04 DIAGNOSIS — O009 Unspecified ectopic pregnancy without intrauterine pregnancy: Secondary | ICD-10-CM

## 2013-11-04 DIAGNOSIS — O99891 Other specified diseases and conditions complicating pregnancy: Secondary | ICD-10-CM

## 2013-11-04 LAB — BASIC METABOLIC PANEL
Anion Gap: 7 mmol/L (ref 3–16)
BUN: 8 mg/dL (ref 7–25)
CO2: 23 mmol/L (ref 21–31)
Calcium: 9.1 mg/dL (ref 8.6–10.3)
Chloride: 106 mmol/L (ref 98–110)
Creatinine: 0.7 mg/dL (ref 0.60–1.30)
GFR MDRD Af Amer: 117 See note.
GFR MDRD Non Af Amer: 97 See note.
Glucose: 92 mg/dL (ref 70–100)
Osmolality, Calculated: 280 mOsm/kg (ref 278–305)
Potassium: 4.1 mmol/L (ref 3.5–5.3)
Sodium: 136 mmol/L (ref 133–146)

## 2013-11-04 LAB — HEPATIC FUNCTION PANEL, SERUM
ALT: 28 U/L (ref 7–52)
AST (SGOT): 20 U/L (ref 13–39)
Albumin: 4.3 g/dL (ref 3.5–5.7)
Alkaline Phosphatase: 40 U/L (ref 34–104)
Bilirubin, Direct: 0.09 mg/dL (ref 0.03–0.18)
Bilirubin, Indirect: 0.31 mg/dL (ref 0.20–0.70)
Total Bilirubin: 0.4 mg/dL (ref 0.3–1.2)
Total Protein: 7 g/dL (ref 6.4–8.9)

## 2013-11-04 LAB — CBC
Hematocrit: 39 % (ref 35.0–45.0)
Hemoglobin: 12.7 g/dL (ref 11.7–15.5)
MCH: 29.4 pg (ref 27.0–33.0)
MCHC: 32.7 g/dL (ref 32.0–36.0)
MCV: 89.7 fL (ref 80.0–100.0)
MPV: 7.5 fL (ref 7.5–11.5)
Platelets: 328 10*3/uL (ref 140–400)
RBC: 4.34 10*6/uL (ref 3.80–5.10)
RDW: 15.1 % (ref 11.0–15.0)
WBC: 11.6 10*3/uL (ref 3.8–10.8)

## 2013-11-04 LAB — HCG, QUANTITATIVE, PREGNANCY: hCG Quant: 19726 m[IU]/mL

## 2013-11-04 LAB — ANTIBODY SCREEN: Antibody Screen: NEGATIVE

## 2013-11-04 LAB — ABO/RH: Rh Type: POSITIVE

## 2013-11-04 MED ORDER — succinylcholine (QUELICIN) injection
20 | INTRAMUSCULAR | Status: AC | PRN
Start: 2013-11-04 — End: 2013-11-04
  Administered 2013-11-04: 21:00:00 60 via INTRAVENOUS

## 2013-11-04 MED ORDER — lactated ringers infusion
INTRAVENOUS | Status: AC
Start: 2013-11-04 — End: 2013-11-04
  Administered 2013-11-04: 22:00:00 via INTRAVENOUS
  Administered 2013-11-04: 19:00:00 125 mL/h via INTRAVENOUS
  Administered 2013-11-04: 20:00:00 via INTRAVENOUS

## 2013-11-04 MED ORDER — atropine injection
0.4 | INTRAMUSCULAR | Status: AC | PRN
Start: 2013-11-04 — End: 2013-11-04
  Administered 2013-11-04: 22:00:00 .8 via INTRAVENOUS
  Administered 2013-11-04: 22:00:00 .4 via INTRAVENOUS

## 2013-11-04 MED ORDER — acetaminophen (OFIRMEV) Soln
1000 | INTRAVENOUS | Status: AC | PRN
Start: 2013-11-04 — End: 2013-11-04
  Administered 2013-11-04: 21:00:00 1000 via INTRAVENOUS

## 2013-11-04 MED ORDER — phenylephrine (NEO-SYNEPHRINE) injection
10 | INTRAMUSCULAR | Status: AC | PRN
Start: 2013-11-04 — End: 2013-11-04
  Administered 2013-11-04 (×3): 100 via INTRAVENOUS

## 2013-11-04 MED ORDER — fentaNYL (SUBLIMAZE) injection
50 | INTRAMUSCULAR | Status: AC | PRN
Start: 2013-11-04 — End: 2013-11-04
  Administered 2013-11-04 (×4): 50 via INTRAVENOUS

## 2013-11-04 MED ORDER — dexamethasone (DECADRON) injection
4 | INTRAMUSCULAR | Status: AC | PRN
Start: 2013-11-04 — End: 2013-11-04
  Administered 2013-11-04: 21:00:00 10 via INTRAVENOUS

## 2013-11-04 MED ORDER — neostigmine (PROSTIGMINE) injection
1 | INTRAMUSCULAR | Status: AC | PRN
Start: 2013-11-04 — End: 2013-11-04
  Administered 2013-11-04: 22:00:00 1 via INTRAVENOUS
  Administered 2013-11-04: 22:00:00 2 via INTRAVENOUS

## 2013-11-04 MED ORDER — bupivacaine (MARCAINE) 0.5 % (5 mg/mL) injection
0.5 | INTRAMUSCULAR | Status: AC | PRN
Start: 2013-11-04 — End: 2013-11-04
  Administered 2013-11-04: 22:00:00 16 via SUBCUTANEOUS

## 2013-11-04 MED ORDER — ondansetron (ZOFRAN) 4 mg/2 mL injection
4 | INTRAMUSCULAR | Status: AC | PRN
Start: 2013-11-04 — End: 2013-11-04
  Administered 2013-11-04: 22:00:00 4 via INTRAVENOUS

## 2013-11-04 MED ORDER — sodium chloride, irrigation 0.9 % irrigation
0.9 | Status: AC | PRN
Start: 2013-11-04 — End: 2013-11-04
  Administered 2013-11-04: 21:00:00 500

## 2013-11-04 MED ORDER — oxyCODONE-acetaminophen (PERCOCET) 5-325 mg per tablet
5-325 | ORAL_TABLET | Freq: Four times a day (QID) | ORAL | Status: AC | PRN
Start: 2013-11-04 — End: ?

## 2013-11-04 MED ORDER — rocuronium (ZEMURON) injection
10 | INTRAVENOUS | Status: AC | PRN
Start: 2013-11-04 — End: 2013-11-04
  Administered 2013-11-04: 22:00:00 5 via INTRAVENOUS
  Administered 2013-11-04: 21:00:00 20 via INTRAVENOUS
  Administered 2013-11-04: 21:00:00 10 via INTRAVENOUS

## 2013-11-04 MED ORDER — lidocaine (PF) 20 mg/mL (2 %) Soln
20 | INTRAVENOUS | Status: AC | PRN
Start: 2013-11-04 — End: 2013-11-04
  Administered 2013-11-04: 21:00:00 50 via INTRAVENOUS

## 2013-11-04 MED ORDER — HYDROmorphone (DILAUDID) injection Syrg
2 | INTRAMUSCULAR | Status: AC | PRN
Start: 2013-11-04 — End: 2013-11-04
  Administered 2013-11-04: 22:00:00 .6 via INTRAVENOUS
  Administered 2013-11-04 (×2): .4 via INTRAVENOUS
  Administered 2013-11-04: 22:00:00 .2 via INTRAVENOUS

## 2013-11-04 MED ORDER — midazolam (PF) (VERSED) injection
1 | INTRAMUSCULAR | Status: AC | PRN
Start: 2013-11-04 — End: 2013-11-04
  Administered 2013-11-04: 20:00:00 2 via INTRAVENOUS

## 2013-11-04 MED ORDER — propofol 10 mg/ml (DIPRIVAN) injection
10 | INTRAVENOUS | Status: AC | PRN
Start: 2013-11-04 — End: 2013-11-04
  Administered 2013-11-04: 21:00:00 200 via INTRAVENOUS

## 2013-11-04 NOTE — Unmapped (Signed)
Pt meets pacu discharge criteria. Alert and ox4. Vss. Procedural site unchanged. Tolerating po without nausea. Reports minimal pain, declines pain meds. Awaiting visitor arrival to hospital.

## 2013-11-04 NOTE — Unmapped (Signed)
Faculty Gyn consultation      Faculty Attestation  I have interviewed & examined the patient & reviewed pertinent medical data and imaging studies.  Refer to the resident's note for details of clinical findings and assessment & plan with which I agree (  with corrections and/or additions as noted below).      CC:   Chief Complaint   Patient presents with   ??? Follow-up   Presents for dating ultrasound    History:  33 y.o. [id number]  No LMP recorded. Patient is pregnant.     patient prsented today for a first trimester ultrasound for dating.  Receives prenatal care at [facility].  States had episode of severe lower abdominal pain last night, lasting about 20 minutes.  Had some vaginal bleeding last week which has since stopped.    PMH/PSH/FH/Soc Hx reviewed      Physical Exam:  Filed Vitals:    11/04/13 1115   BP: 134/64   Pulse: 64   Temp: 98.4 ??F (36.9 ??C)     Notable for   Alert, NAD  Sitting up on stretcher  Abdomen:  Obese,  Soft, NT without HSM    Labs  Labs notable for   Lab Results   Component Value Date    PREGTESTUR Negative 04/28/2012    HCGQUANT 19726.00 11/04/2013     Ultrasound by perinatal center with 2.6 largest diameter adnexal mass with moderate amount of free fluid   Empty uterus       Impression/Plan:   Clinical findings concerning for ectopic pregnancy.      No recent bleeding that would be consistent with recent spontaneous miscarriage that would still have HCG of 19K    With empty uterus, HCG of 19K, adnexal mass and moderate free fluid, assessment and treatment of probable ectopic pregnancy indicated   Patient voices understanding of the planned procedure and the associated risks.   Will proceed when OR room available.

## 2013-11-04 NOTE — Unmapped (Signed)
Subjective:       Patient ID: Ashlee Garcia is a 33 y.o. female.    HPI    Ashlee Garcia is a 33 y.o. G4P2012 at 5+2 weeks by LMP who was [facility] after dating ultrasound found to be concerning for ectopic pregnancy. Patient reports episode of bright red vaginal spotting on 2/19 associated with right-sided abdominal pain and then one week ago had dark blood for one day with no pain. Last night had diffuse lower abdominal pain that she described excruciating. She was able to fall asleep last night and woke up with no pain.     Last ate fruit snacks at 9:15am.     Gyn Hx:  REmote h/o STI unsure what but was treated  Denies h/o abn paps    OB History   Gravida Para Term Preterm AB SAB TAB Ectopic Multiple Living   4 2 2  1 1    2      # Outcome Date GA Lbr Len/2nd Weight Sex Delivery Anes PTL Lv   4 CUR            3 TRM 03/2011     CS   Y      Comments: scheduled repeat   2 SAB 03/2008 [redacted]w[redacted]d          1 TRM 04/2006 [redacted]w[redacted]d  4054 g (8 lb 15 oz) F CS EPI  Y      Comments: fail to progress        Past Medical History   Diagnosis Date   ??? STD (sexually transmitted disease)      unsure but was treated   ??? Gestational diabetes      with first pregnancy and was on metformin for 1 year post-partum in 2008     Past Surgical History   Procedure Laterality Date   ??? 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 Cesarean section       x 2   ??? Adenoidectomy       as a child     Family History   Problem Relation Age of Onset   ??? Breast cancer Neg Hx    ??? Clotting disorder Neg Hx    ??? Ovarian cancer Neg Hx      History   Substance Use Topics   ??? Smoking status: Current Every Day Smoker -- 1.00 packs/day     Types: Cigarettes   ??? Smokeless tobacco: Not on file   ??? Alcohol Use: No      Comment: 2015     Meds: None    Allergy: PCN as a chid        Review of Systems   Constitutional: Negative for fever and chills.   Respiratory: Positive for shortness of breath (with smoking). Negative for chest tightness.       Cardiovascular: Negative for chest pain.   Gastrointestinal: Positive for abdominal pain. Negative for nausea, vomiting, diarrhea and constipation.   Genitourinary: Negative for dysuria, hematuria, vaginal bleeding and vaginal discharge.   Skin: Negative for rash.   Neurological: Negative for dizziness and light-headedness.   Psychiatric/Behavioral: Negative for self-injury and dysphoric mood. The patient is not nervous/anxious.        Objective:    Physical Exam    Filed Vitals:    11/04/13 1115   BP: 134/64   Pulse: 64   Temp: 98.4 ??F (36.9 ??C)   TempSrc: Oral     Height: 5' 5 (1.651 m)   Weight: 187 lb 14.4 oz (85.231 kg)     Gen: in NAD  Neuro: A&Ox3  CV: RRR  Pulm: CTAB  Abd: soft, obese, non-tender to palpation  GU: normal appearing external genitalia, vagina and cervix that is normal and without lesions. Mod amount of white discharge in vault. No blood in vault. No active bleeding. Bimanual exam tender in right and left adnexa. No CMT. Os closed.   Ext: well perfused, no edema    Labs:  Lab Results   Component Value Date    WBC 11.6* 11/04/2013    HGB 12.7 11/04/2013    HCT 39.0 11/04/2013    MCV 89.7 11/04/2013    PLT 328 11/04/2013     BMP, Liver panel, Type and screen, beta-hCG pending  Ultrasound:  11/04/2013: 5+2 weeks by LMP. No GS. No YS. No fetal pole. ES thickness 15mm. Right adnexal complex mass measuring 18 x 26 x 19 mm. Moderate amount of free fluid present.           Assessment:     Ashlee Garcia is a 33 y.o. G4P2012 at 5+2 weeks by LMP with likely ectopic pregnancy based on ultrasound findings and h/o bleeding and abdominal pain.     Plan:     - Discussed need for surgical management with patient as moderate amount of free fluid on ultrasound, history of pain/bleeding and amount of pain on bimanual exam. Because of the pain on exam and free fluid on ultrasound, patient is not a good candidate for methotrexate. Plan to go to OR for diagnostic laparoscopy with unilateral salpingectomy vs  oophorectomy vs salpingostomy. Pt counseled on R/B/A/I of the procedure including risk of blood loss requiring transfusion, infection, damage to surrounding structures including bowel and bladder requiring additional procedures. All questions answered. She voiced understanding and agreement. Surgical consent signed and valid.   - NPO (last ate fruit snacks at 0915)  - Labs pending including Type and screen, beta-hCG. CBC as per above.   - Anesthesia, Main OR notified.     Will transfer patient to Same Day Surgery now.     Discussed above with Dr. C Van Hook     Tyreak Reagle M. Kindred Heying, MD, MPH  Ob/Gyn PGY-3  Pager [phone]

## 2013-11-04 NOTE — Unmapped (Signed)
Anesthesia Extubation Criteria:    Airway Device: endotracheal tube    Emergence Details:      Smooth      _x_      Stormy       __       Prolonged   __     Extubation Criteria:      Motor strength intact       _x_      Follows commands        _x_      Good airway reflexes      _x_      OP suctioned                  _x_        Follows commands:  Yes     Patient extubated:  Yes

## 2013-11-04 NOTE — Unmapped (Signed)
Subjective:       Patient ID: Ashlee Garcia is a 33 y.o. female.    HPI    Ashlee Garcia is a 33 y.o. [id number] at 5+2 weeks by LMP who [facility] dating ultrasound found to be concerning for ectopic pregnancy. Patient reports episode of bright red vaginal spotting on 2/19 associated with right-sided abdominal pain and then one week ago had dark blood for one day with no pain. Last night had diffuse lower abdominal pain that she described excruciating. She was able to fall asleep last night and woke up with no pain.     Last ate fruit snacks at 9:15am.     Gyn Hx:  REmote h/o STI unsure what but was treated  Denies h/o abn paps    OB History   Gravida Para Term Preterm AB SAB TAB Ectopic Multiple Living   4 2 2  1 1    2       # Outcome Date GA Lbr Len/2nd Weight Sex Delivery Anes PTL Lv   4 CUR            3 TRM 03/2011     CS   Y      Comments: scheduled repeat   2 SAB 03/2008 [redacted]w[redacted]d          1 TRM 04/2006 [redacted]w[redacted]d  4054 g (8 lb 15 oz) F CS EPI  Y      Comments: fail to progress        Past Medical History   Diagnosis Date   ??? STD (sexually transmitted disease)      unsure but was treated   ??? Gestational diabetes      with first pregnancy and was on metformin for 1 year post-partum in 2008     Past Surgical History   Procedure Laterality Date   ??? 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 Cesarean section       x 2   ??? Adenoidectomy       as a child     Family History   Problem Relation Age of Onset   ??? Breast cancer Neg Hx    ??? Clotting disorder Neg Hx    ??? Ovarian cancer Neg Hx      History   Substance Use Topics   ??? Smoking status: Current Every Day Smoker -- 1.00 packs/day     Types: Cigarettes   ??? Smokeless tobacco: Not on file   ??? Alcohol Use: No      Comment: 2015     Meds: None    Allergy: PCN as a chid        Review of Systems   Constitutional: Negative for fever and chills.   Respiratory: Positive for shortness of breath (with smoking). Negative for chest tightness.       Cardiovascular: Negative for chest pain.   Gastrointestinal: Positive for abdominal pain. Negative for nausea, vomiting, diarrhea and constipation.   Genitourinary: Negative for dysuria, hematuria, vaginal bleeding and vaginal discharge.   Skin: Negative for rash.   Neurological: Negative for dizziness and light-headedness.   Psychiatric/Behavioral: Negative for self-injury and dysphoric mood. The patient is not nervous/anxious.        Objective:    Physical Exam    Filed Vitals:    11/04/13 1115   BP: 134/64   Pulse: 64   Temp: 98.4 ??F (36.9 ??C)   TempSrc: Oral  Height: 5' 5 (1.651 m)   Weight: 187 lb 14.4 oz (85.231 kg)     Gen: in NAD  Neuro: A&Ox3  CV: RRR  Pulm: CTAB  Abd: soft, obese, non-tender to palpation  GU: normal appearing external genitalia, vagina and cervix that is normal and without lesions. Mod amount of white discharge in vault. No blood in vault. No active bleeding. Bimanual exam tender in right and left adnexa. No CMT. Os closed.   Ext: well perfused, no edema    Labs:  Lab Results   Component Value Date    WBC 11.6* 11/04/2013    HGB 12.7 11/04/2013    HCT 39.0 11/04/2013    MCV 89.7 11/04/2013    PLT 328 11/04/2013     BMP, Liver panel, Type and screen, beta-hCG pending  Ultrasound:  11/04/2013: 5+2 weeks by LMP. No GS. No YS. No fetal pole. ES thickness 15mm. Right adnexal complex mass measuring 18 x 26 x 19 mm. Moderate amount of free fluid present.           Assessment:     Ashlee Garcia is a 33 y.o. [phone] at 5+2 weeks by LMP with likely ectopic pregnancy based on ultrasound findings and h/o bleeding and abdominal pain.     Plan:     - Discussed need for surgical management with patient as moderate amount of free fluid on ultrasound, history of pain/bleeding and amount of pain on bimanual exam. Because of the pain on exam and free fluid on ultrasound, patient is not a good candidate for methotrexate. Plan to go to OR for diagnostic laparoscopy with unilateral salpingectomy vs  oophorectomy vs salpingostomy. Pt counseled on R/B/A/I of the procedure including risk of blood loss requiring transfusion, infection, damage to surrounding structures including bowel and bladder requiring additional procedures. All questions answered. She voiced understanding and agreement. Surgical consent signed and valid.   - NPO (last ate fruit snacks at 0915)  - Labs pending including Type and screen, beta-hCG. CBC as per above.   - Anesthesia, Main OR notified.     Will transfer patient to Same Day Surgery now.     Discussed above with Dr. Farrel Gobble Caswell Corwin, MD, MPH  Ob/Gyn PGY-3  Pager [phone]

## 2013-11-04 NOTE — Unmapped (Signed)
[facility]   Practice Guidelines  If you do not have an established payer for your healthcare please call our financial department at [phone] or email: [email or web address].    Before coming to the [facility], bring the items listed below with you for your visit.    Insurance Card    State or Government Issued Photo ID (for example a driver???s license)  Arrival Time  We want to make the time spent in our waiting room short for our patients.    Please allow 15-20 minutes to park and/or walk to the [facility].  You will also need 5-10 minutes to check-in before your appointment.    If you get here more than 15 minutes after your appointment time, you may be asked to make another appointment.    Pregnant Patients - A nurse will talk to you about the reason why you are late.  Forms, Prescription Refills, Dr. Notes, Lab Result, etc.  Doctors and nurses have a full schedule to see patients Monday through Friday.  Since doctors and nurses are taking care of patients, requests may take longer to complete than on the same day.  You can:    Make an appointment if you need a birth control shot.    Make a doctor and/or a nurse appointment to talk about your test results.    If you need refills, call your pharmacy directly to ask for prescription refills.  Getting refills can take up to three business days to complete.    If you have forms that need to be filled out by the nurses or doctors, it will take about five business days.  You will be called when the form is ready.  Cancellation Policy  We hope that you will be seen by your doctor or nurse as soon as possible.  You can help by making every effort to attend all appointments that you make.    Call to cancel at least 24 hours before your appointment time or you will be counted as a ???no-show???.    If you do not cancel and do not show up for 3 appointments, you may be given suggestions on where to find another doctor???s office for your  care.  FMLA Forms    Please bring your FMLA forms to your appointment to discuss your request with your doctor and nurse.  Forms will be ready in 5 business days.

## 2013-11-04 NOTE — Unmapped (Signed)
Pt ambulated independently to restroom with steady gait. + void without difficulty. Discharge instructions given to patient and visitor. Questions answered. piv discontinued.

## 2013-11-04 NOTE — Unmapped (Signed)
Anesthesia Post Note    Patient: Ashlee Garcia    Procedure(s) Performed: Procedure(s):  LAPAROSCOPY DIAGNOSTIC FOR ECTOPIC, removal of right fallopian tube for ectopic pregnancy    Anesthesia type: general endotracheal    Patient location: PACU    Post pain: Adequate analgesia    Post assessment: no apparent anesthetic complications    Last Vitals:   Filed Vitals:    11/04/13 1940   BP: 101/59   Pulse: 58   Temp:    Resp: 15   SpO2: 98%       Post vital signs: stable    Level of consciousness: awake    Complications: None

## 2013-11-04 NOTE — Unmapped (Addendum)
ANESTHESIOLOGY PRE-PROCEDURAL EVALUATION    Ashlee Garcia is a 33 y.o. year old female presenting for:    Procedure(s):  LAPAROSCOPY DIAGNOSTIC FOR ECTOPIC    Surgeon:   Alva Garnet, MD    Anesthesia Evaluation         History of anesthetic complications (PONV with anesthesia as a child)   I have reviewed the History and Physical Exam, any relevant changes are noted in the anesthesia pre-operative evaluation.    Medical History / Review of Systems:    Cardiovascular:    Exercise tolerance:  [employer] Met score: 5 - Walking four miles per hour. Social dancing. Washing a car.  (-) hypertension, valvular problems/murmurs, past MI, CABG/stent, dysrhythmias, angina, CHF, orthopnea.    Neuro/Muscoloskeletal/Psych:      (-) seizures, neuromuscular disease, CVA, headaches, psychiatric history, arthritis, back problems.     Pulmonary:      (-) no pneumonia, asthma, shortness of breath, recent URI, no active tuberculosis.   ROS comment: Smokes 1/2 per day for 17 years.      GI/Hepatic/Renal:    GERD (Diet related ) is well controlled.    (-) PUD, liver disease, renal disease, no difficulty swallowing.    Endo/Other:        (-) diabetes mellitus, hypothyroidism, hyperthyroidism, no anemia, no thrombocytopenia, no bleeding disorder.     Comments: History of gestational diabetes       PAST MEDICAL HISTORY:  Past Medical History   Diagnosis Date   ??? STD (sexually transmitted disease)      unsure but was treated   ??? Gestational diabetes      with first pregnancy and was on metformin for 1 year post-partum in 2008       PAST SURGICAL HISTORY:  Past Surgical History   Procedure Laterality Date   ??? 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 Cesarean section       x 2   ??? Adenoidectomy       as a child       FAMILY HISTORY:  Family History   Problem Relation Age of Onset   ??? Breast cancer Neg Hx    ??? Clotting disorder Neg Hx    ??? Ovarian cancer Neg Hx        SOCIAL HISTORY:  History     Social History   ??? Marital Status: Divorced      Spouse Name: N/A     Number of Children: N/A   ??? Years of Education: N/A     Occupational History   ??? Not on file.     Social History Main Topics   ??? Smoking status: Current Every Day Smoker -- 1.00 packs/day     Types: Cigarettes   ??? Smokeless tobacco: Not on file   ??? Alcohol Use: No      Comment: 2015   ??? Drug Use: Yes     Special: Marijuana      Comment: 2-3 x per week   ??? Sexual Activity: Yes     Partners: Male     Other Topics Concern   ??? Not on file     Social History Narrative   ??? No narrative on file       MEDICATIONS:  No current facility-administered medications on file prior to encounter.     Current Outpatient Prescriptions on File Prior to Encounter   Medication Sig Dispense Refill   ??? PEDIATRIC MULTIVIT COMB.  NO.49 ([employer] GUMMIES ORAL) Take by mouth.           ALLERGIES:  Allergies   Allergen Reactions   ??? Penicillins Hives       VITAL SIGNS:  Wt Readings from Last 3 Encounters:   11/04/13 187 lb 14.4 oz (85.231 kg)   04/28/12 180 lb (81.647 kg)   11/27/10 179 lb 4.8 oz (81.33 kg)     Ht Readings from Last 3 Encounters:   11/04/13 5' 5 (1.651 m)   04/28/12 5' 5 (1.651 m)   10/09/10 5' 4 (1.626 m)     Temp Readings from Last 3 Encounters:   11/04/13 97.9 ??F (36.6 ??C) Oral   11/04/13 98.4 ??F (36.9 ??C) Oral   11/04/13 97.9 ??F (36.6 ??C) Oral     BP Readings from Last 3 Encounters:   11/04/13 105/66   11/04/13 134/64   11/04/13 105/66     Pulse Readings from Last 3 Encounters:   11/04/13 71   11/04/13 64   11/04/13 71     SpO2 Readings from Last 3 Encounters:   11/04/13 99%   11/04/13 99%   04/28/12 98%       Physical Exam:    Airway:     Mallampati: I  Mouth Opening: >2 FB  TM distance: > = 3 FB  Neck ROM: full  (-) no facial hair, neck not short      Dental:            Pulmonary:      (+) decreased breath sounds (Decreased breath sounds in the right lower base.  Clear throughout lung field.).    Cardiovascular:  - normal exam   Rhythm: regular  Rate: normal    Neuro/Musculoskeletal/Psych:  -  normal neurological exam.   Mental status: alert and oriented to person, place and time.    No sensory deficit.    Motor strength is normal.      Abdominal:    - normal exam  Obese.  Abdomen: soft.  Bowel sounds: normal.    Current OB Status:    Gravida 4   Para 2          Other Findings:        LAB RESULTS:  Lab Results   Component Value Date    WBC 11.6* 11/04/2013    HGB 12.7 11/04/2013    HCT 39.0 11/04/2013    MCV 89.7 11/04/2013    PLT 328 11/04/2013       No results found for this basename: ABORH       Lab Results   Component Value Date    GLUCOSE 92 11/04/2013    BUN 8 11/04/2013    CO2 23 11/04/2013    CREATININE 0.70 11/04/2013    K 4.1 11/04/2013    NA 136 11/04/2013    CL 106 11/04/2013    CALCIUM 9.1 11/04/2013    ALBUMIN 4.3 11/04/2013    PROT 7.0 11/04/2013    ALKPHOS 40 11/04/2013    ALT 28 11/04/2013    AST 22 10/08/2010    BILITOT 0.4 11/04/2013       No results found for this basename: PT, PTT, INR       Lab Results   Component Value Date    PREGTESTUR Negative 04/28/2012    HCGQUANT 19726.00 11/04/2013       Anesthesia Plan:    ASA 1     Anesthesia Type:  general endotracheal.  Intravenous induction.      Plan, alternatives, and risks of anesthesia, including death, have been explained to and discussed with the patient/legal guardian.  By my assessment, the patient/legal guardian understands and agrees.  Scenario presented in detail.  Questions answered.    Use of blood products discussed with patient whom consented to blood products.   Plan discussed with CRNA.

## 2013-11-04 NOTE — Unmapped (Signed)
[facility]                      [facility]     PATIENT NAME:   Ashlee Garcia, [name]           MRN: [id number]  DATE OF BIRTH:  1981/07/28                     CSN: [id number]  SURGEON:        Santina Evans L. Jesusita Oka, M.D.    ADMIT DATE: 11/04/2013  SERVICE:  DICTATED BY:    Gregery Na, M.D.              SURGERY DATE: 11/04/2013                                    OPERATIVE REPORT     ATTENDING SURGEON: Alva Garnet, MD.     ASSISTANT(S):  Resident surgeons:  1. Renford Dills, MD.  2. Gregery Na, MD.     PREOPERATIVE DIAGNOSIS(ES):  Presumed ectopic pregnancy.     POSTOPERATIVE DIAGNOSIS(ES):  Right tubal pregnancy with hemoperitoneum.     PROCEDURE(S) PERFORMED: Laparoscopic right salpingectomy.     ANESTHESIA:  General.     FINDINGS: Right tubal ectopic with distal half of the tube dilated.  Appeared  to have ruptured with some extrusion.  Hemoperitoneum of approximately 100  mL.  Normal-appearing bilateral ovaries.  Normal-appearing left tube. Omental adhesion to anterior abdominal wall inferior to umbilicus.      SPECIMEN(S):  Right fallopian tube with ectopic pregnancy to pathology.     INTRAVENOUS FLUIDS:  1300 mL of crystalloid.     ESTIMATED BLOOD LOSS:  100 mL.     URINE OUTPUT:  700 mL of clear urine.     COMPLICATIONS:  None.     INDICATIONS: The patient is a 33 year old, G4, P2-0-1-2, who at 5 and 2 weeks  by LMP was noted to have the ultrasound findings consistent with ectopic  pregnancy at her dating ultrasound appointment.  The presumed ectopic  pregnancy on ultrasound measured approximately 18 x 26 x 19 mm.  There is a  moderate amount of free fluid in the pelvis and the patient had pain on  bimanual exam.  Secondary to the moderate amount of free fluid and pain on  exam, the patient was counseled on the need for surgical management of  presumed ectopic pregnancy.  She was counseled on the risks, benefits,  alternatives, and indications  of the procedure and all questions were  answered to her stated satisfaction.  The surgical consent was signed and  valid, and the patient desired to proceed.     DETAILS OF PROCEDURE(S):  The patient was brought to the operating room where  general anesthesia was obtained.  She was placed in dorsal lithotomy position  with Allen type stirrups and prepped and draped in the normal sterile  fashion.  The SCDs were noted to be in place and antibiotics were not  indicated.  A time-out procedure was performed to confirm correct patient and  procedure, and all were in agreement.     Attention was placed to the patient's perineum where a Foley catheter was  inserted into the bladder without difficulty.  A sponge stick was then placed  in the vagina.  Attention was then placed back to the abdomen where  approximately 10 mL of 0.5% Marcaine was injected infraumbilically.  A  scalpel was used to make a 5 mm infraumbilical incision.  The  abdomen was then entered using a 5 mm laparoscope under direct visualization  and without difficulty.  Once it was confirmed that placement was  intraabdominal the CO2 gas was connected and pneumoperitoneum was obtained.  The laparoscope was then inserted through the trocar and a survey of the  abdomen was completed.  At that time, it was noted that the patient's right  fallopian tube was dilated, at least half of it, and it was noted that what  appeared to be an ectopic pregnancy was extruding from the fallopian tube.  Hemoperitoneum was also noted, approximately 100 cc.  Attention was placed to  the patient's right lower quadrant at that time where an additional 5 mL of  0.5% Marcaine was injected and a scalpel was used to make a 5 mm incision.  A  5 mm trocar and sleeve were then introduced intraabdominally without  difficulty under direct visualization with the laparoscope.  A third port site in the left lower quadrant was made in an identical fashion. An atraumatic  grasper was then used  to grasp the fallopian tube and the Harmonic device was  used to complete a right salpingectomy.  The surgical site at that time was  noted to be hemostatic.  At this time, the 8 mm left lower quadrant incision  was extended to 10 mm and a 10 mm EndoCatch bag was inserted and then ectopic pregnancy was placed into the EndoCatch bag.  The ectopic pregnancy was then removed through the [facility] and sent to Pathology.  At this time, another survey of the abdomen was completed and the surgical site was still noted to be hemostatic.  The suction irrigator was then used to copiously irrigate and suction out the remaining hemoperitoneum.  Once it was felt that all surgical sites were hemostatic, the Weck device was used to close the fascia of the 10 mm left lower quadrant port site.  All instruments were then removed from the abdomen and the pneumoperitoneum was allowed to deflate.  The port sites were then closed with 4-0 Vicryl and then covered with Dermabond.  All instruments were removed from the vagina and foley catheter removed.     Sponge, lap, and needle counts were correct x2.  The patient tolerated the  procedure well.  She was extubated and brought to the PACU in stable  condition.                                                    Catherine L. Jesusita Oka, M.D.  AG/sd                                 Dictated by:  Gregery Na, M.D.  D:  11/04/2013 18:15  T:  11/05/2013 11:35  Job #:  [id number]           OPERATIVE REPORT                                             PAGE    1  of   1

## 2013-11-04 NOTE — Unmapped (Signed)
Called patient as not shown up to Same Day Surgery from clinic. Patient reports she went to Same Day Surgery and was told she didn't need to be there until 1330 and now is standing outside of the hospital waiting for the person providing child care to her 33 yo to show up. She reports she will go into the hospital as soon as her son is picked up. Reviewed pain and bleeding precautions with patient and advised to go to [facility] as soon as possible.     Huel Coventry, MD, MPH  Ob/Gyn PGY-3  Pager [phone]

## 2013-11-04 NOTE — Unmapped (Signed)
1.  Return or call for fever greater than 100.4, severe pain not relieved with pain medications, inability to keep food or drink down, heavy vaginal bleeding saturating greater than 1 pad per hour.  2.  Do not drive while taking narcotic pain medications.  3.  No heavy lifting greater than 10 pounds.  4.  No sexual intercourse until cleared by your physician. Otherwise activity as tolerated.         PATIENT INSTRUCTIONS  POST-ANESTHESIA    IMMEDIATELY FOLLOWING SURGERY:    Do not drive or operate machinery for the first 24 hours after surgery.    Do not make any important decisions for 24 hours after surgery or while taking narcotic pain medications or sedatives.   Avoid intake of alcohol for 24 hours.    If you develop intractable nausea and vomiting or a severe headache please notify your doctor immediately.   You may feel light-headed, dizzy, or sleepy following surgery.   DO NOT STAY ALONE. A responsible person should stay with you overnight.      QUESTIONS?:    Please feel free to call your physician or the hospital operator at [phone] if you have any questions and ask for the gynecology resident, and they will be happy to assist you.

## 2013-11-04 NOTE — Unmapped (Signed)
Faculty Attestation Procedure Note    I was present and participated from draping through closure of the right port fascia and remained immediately available for the remainder of the procedure.    OPERATIVE Note    Pre-Operative Diagnosis:    Presumed ectopic pregnancy       Post-Operative Diagnosis:    Right tubal pregnancy with hemoperitoneum          Procedure:    Right salpingectomy    Surgeon:    Faculty:  Wilhelmenia Blase   Resident(s):  Lawernce Pitts, MD; Donalynn Furlong, MD       Anesthetic:    GETA    Findings:       Right tubal ectopic with distal 1/2 dilated.  Appeared to have rupture with some extrusion   Hemoperitoneum~100cc    Pathology specimen:    Right fallopian tube        Estimated Blood Loss:   100 cc (hemoperitoneum    Complications:  no complications were noted    Disposition:      To RR in stable condition    Wilhelmenia Blase, MD

## 2013-11-04 NOTE — Unmapped (Signed)
Anesthesia Transfer of Care Note    Patient: Ashlee Garcia  Procedure(s) Performed: Procedure(s):  LAPAROSCOPY DIAGNOSTIC FOR ECTOPIC, removal of right fallopian tube for ectopic pregnancy    Patient location: PACU    Post pain: Adequate analgesia    Post assessment: no apparent anesthetic complications, tolerated procedure well and no evidence of recall    Post vital signs:    Filed Vitals:    11/04/13 1829   BP: 123/84   Pulse: 74   Temp: 98.3 ??F (36.8 ??C)   Resp: 16   SpO2: 97%       Level of consciousness: awake, alert  and oriented    Complications: None

## 2013-11-04 NOTE — Unmapped (Signed)
ODA Infant Death    Parent Information    Mother's Name : Ashlee Garcia  Mother's maiden name: Ashlee Garcia  Marital Status: divorced  Mother's DOB: [birth year] 29[birth year] 1980  Mother's Birthplace: [place]  Mother's address: [address] [place] [address], [place] [address]  Mother's Phone: [phone] (home)     Mother's address and phone confirm: yes    Birth father's name: Ashlee Garcia  Birth Father's DOB: 03/23/1966  Birth Father's Birthplace: [place]    Infant Information    Gestational Age: Gestational Age at Birth: 5 weeks  Live birth: no  Certifying Physician: Alva Garnet, MD  Physician contact number: [phone]  Physician License number: [phone]    Life Center: Outcome/Status: N/A        Autopsy:  N/A                         Disposition: Disposition : [facility] batch burial    Infant Death Circumstances: MOB is a 33 yo [phone] who presented to [facility] for removal of her right fallopian tube for ectopic pregnancy at 5w,2d gestation. Procedure was performed by Alva Garnet, MD, Ashlee Na, MD and Ashlee Furlong, MD.    Patient Grief Reaction:  MOB advised that she was crying at first, but I am okay now. I was excited about the baby, but then I really didn't need another baby. MOB was not tearful when this SW met with her and appeared to be grieving appropriately and within normal limits.    Patient Support System: MOB receives support from FOB Ashlee Garcia, her mother, her best friend, Ashlee Garcia, and her ex-husband.     Cultural/Spiritual/Language: MOB is a 33 yo divorced, multiracial female of [employer] faith who speaks fluent [place]. No known barriers.     Chaplain Notified: No- MOB declined Chaplain services    Resources Provided: SW provided MOB with Pathways to Healing booklet, Ectopic Pregnancy Pamphlet, Miscarriage pamphlet, the [employer] [address] information, [employer] Vigil, Infant Loss Support group information, and Infant Checklist with contact numbers.      SW Assessment: SW advised of infant loss by Ashlee Garcia, [name].     SW presented to PACU 18 and met with MOB. SW introduced self and ODA SW role and offered condolences and support which was accepted. SW confirmed and gathered demographics. MOB was accepting of SW intervention and engaged well with this SW. MOB was given a teddy bear and the signifacance of the bear was explained. MOB was accepting of the bear. SW learned that MOB has an 5 year old daughter and a 54 year old son and that she has previously experienced a loss. SW and MOB discussed her feelings and that even if she feels okay now, she may experience feelings of sadness and loss in the future. SW encouraged her to reach out for help if needed and SW presented MOB with resources listed above and explained each one. MOB declined to name her infant. SW presented options for infant's disposition and MOB chose [facility] group cremation. MOB agreed to a follow up call and declined an invitation to the [facility] infant burial in May 2015. SW presented her with copies of the disposition papers and placed them in her folder with the resources given. MOB's best friend Ashlee Garcia is to pick her up from [facility] at d/c. SW offered ODA support and assistance 24/7.     Hand off of Care:   Disposition: [facility] group cremation with ashes  buried at [facility] in May 2015.  MOB does not desire to be invited to the [place], or to have a Report of Fetal Death Filed with [employer].   MOB is okay with a follow up call.    Merrilee Seashore, MSW,LSW  [phone]

## 2013-11-04 NOTE — Unmapped (Signed)
Surgery/Procedure Scheduling Form    Ashlee Garcia   [address] [place] [place] [address]  [place] [address]     Date of Birth:  1981/08/16  MRN:  [id number]   Sex:  female  Phone Number:  [phone] (home)       PROCEDURE INFORMATION    Procedure Date:  11/04/13  Alternate Date:      Surgery Time:    Length of Surgery:  1 hour    PROCEDURE:  Diagnostic laparoscopy  DIAGNOSIS:  Suspected ectopic pregnancy    Size/Depth:    Anatomical Location:      Surgeon: Kathrin Greathouse    Co-Surgeon:      Assistant:     Resident:      Lysle Morales    Facility:  [facility] Type:  Service    Patient will go to:  PACU    Admit Status:  Outpatient    ANESTHESIA INFORMATION    Anesthesia Type:  General    Post-Op Epidural:  No    TAP Blocks:  No    Position:  Lithotomy    Special Equipment/Supplies Needed:  None    Allergies   Allergen Reactions   ??? Penicillins Hives       Latex Sensitive:  No    PRE-ADMISSION TESTING    Patient does not need PAT appointment.    Pre-Admission Testing done at:    Date:    Time:    H&P Appointment at:  Clinic  Date:  11/04/13 Time:  12:02 PM     INSURANCE INFORMATION      PRIMARY INSURANCE   Payor: @RFLCVGPAYOR @  Plan: @RFLCVGPLAN @    Group Number: @RFLCVGGRPNUM @  Insurance Type:    Subscriber Name: @SUBSCRIBERNAME @  Subscriber DOB:     Subscriber ID: @[id number] @  Garment/textile technologist. Rel. to Subscriber:    SECONDARY INSURANCE   Payor:  Plan:    Group Number:  Insurance Type:    Subscriber Name:  Subscriber DOB:    Subscriber ID: @SUBIDSECONDARY @  Pat. Rel. to Subscriber:

## 2013-11-05 NOTE — Unmapped (Signed)
Called patient and informed her of post op appointment for Tuesday 11/23/13 at 1400.

## 2013-11-05 NOTE — Unmapped (Signed)
PT called SW asking for assistance in securing her pain meds.  Pt states she had same day surgery yesterday, had meds sent to [facility], yet they don't have narcotics.  Pt needs another Rx as would send/take to [facility] in [place].  Pt states she tried to call RN line, yet states not open (RN had staff mtg).  SW will explore & ensure pt is called back.     Inform T Mays RN, phone RN of above, who is aware as [employer] also called in to inform.

## 2013-11-05 NOTE — Unmapped (Signed)
Ms Steffanie Dunn RN at [facility] called stating that the patient had surgery on 11/04/13 and did not receive the Rx for Percocet. Spoke to Dr. Lawernce Pitts and she stated that she will drop off a Written Rx for patient to pick up at the [facility], and the patient was notified.

## 2013-11-07 NOTE — Unmapped (Signed)
Please see my H&P note under the admission

## 2013-11-09 NOTE — Unmapped (Signed)
OBGYN US BILLING ONLY  R MELTON

## 2013-11-23 ENCOUNTER — Ambulatory Visit: Payer: PRIVATE HEALTH INSURANCE

## 2014-07-27 ENCOUNTER — Encounter (HOSPITAL_COMMUNITY): Payer: Self-pay | Admitting: Emergency Medicine

## 2014-07-27 ENCOUNTER — Emergency Department (HOSPITAL_COMMUNITY)
Admission: EM | Admit: 2014-07-27 | Discharge: 2014-07-27 | Disposition: A | Discharge status: Home / Self Care | Payer: BC Managed Care – PPO | Attending: Emergency Medicine | Admitting: Emergency Medicine

## 2014-07-27 DIAGNOSIS — K029 Dental caries, unspecified: Secondary | ICD-10-CM | POA: Insufficient documentation

## 2014-07-27 DIAGNOSIS — K088 Other specified disorders of teeth and supporting structures: Secondary | ICD-10-CM | POA: Diagnosis not present

## 2014-07-27 DIAGNOSIS — Z8739 Personal history of other diseases of the musculoskeletal system and connective tissue: Secondary | ICD-10-CM | POA: Diagnosis not present

## 2014-07-27 DIAGNOSIS — K0889 Other specified disorders of teeth and supporting structures: Secondary | ICD-10-CM

## 2014-07-27 DIAGNOSIS — R51 Headache: Secondary | ICD-10-CM | POA: Insufficient documentation

## 2014-07-27 DIAGNOSIS — R1110 Vomiting, unspecified: Secondary | ICD-10-CM | POA: Insufficient documentation

## 2014-07-27 DIAGNOSIS — Z792 Long term (current) use of antibiotics: Secondary | ICD-10-CM | POA: Insufficient documentation

## 2014-07-27 DIAGNOSIS — J45909 Unspecified asthma, uncomplicated: Secondary | ICD-10-CM | POA: Diagnosis not present

## 2014-07-27 DIAGNOSIS — Z79899 Other long term (current) drug therapy: Secondary | ICD-10-CM | POA: Insufficient documentation

## 2014-07-27 DIAGNOSIS — Z791 Long term (current) use of non-steroidal anti-inflammatories (NSAID): Secondary | ICD-10-CM | POA: Insufficient documentation

## 2014-07-27 DIAGNOSIS — Z8669 Personal history of other diseases of the nervous system and sense organs: Secondary | ICD-10-CM | POA: Insufficient documentation

## 2014-07-27 MED ORDER — PENICILLIN V POTASSIUM 500 MG PO TABS: 500.0000 mg | ORAL_TABLET | Freq: Three times a day (TID) | ORAL | Status: DC

## 2014-07-27 MED ORDER — PENICILLIN V POTASSIUM 250 MG PO TABS
500.0000 mg | ORAL_TABLET | Freq: Once | ORAL | Status: AC
  Administered 2014-07-27: 500 mg via ORAL
  Filled 2014-07-27: qty 2

## 2014-07-27 MED ORDER — HYDROCODONE-ACETAMINOPHEN 5-325 MG PO TABS: 1.0000 | ORAL_TABLET | ORAL | Status: DC | PRN

## 2014-07-27 NOTE — Discharge Instructions (Signed)

## 2014-07-27 NOTE — ED Provider Notes (Signed)
CSN: [id number]     Arrival date & time 07/27/14  1930 History   First MD Initiated Contact with Patient 07/27/14 1958     Chief Complaint  Patient presents with  . Dental Pain  . Emesis     (Consider location/radiation/quality/duration/timing/severity/associated sxs/prior Treatment) Patient is a 33 y.o. female presenting with tooth pain and vomiting. The history is provided by the patient. No language interpreter was used.  Dental Pain Location:  Lower Lower teeth location:  31/RL 2nd molar and 30/RL 1st molar Quality:  Aching Severity:  Moderate Onset quality:  Gradual Duration:  1 day Timing:  Constant Progression:  Worsening Associated symptoms: facial pain   Associated symptoms: no difficulty swallowing, no facial swelling and no fever   Emesis Associated symptoms: no abdominal pain and no chills     Past Medical History  Diagnosis Date  . PONV (postoperative nausea and vomiting)   . Spinal headache   . Ganglion cyst of wrist 08/2011    left  . Asthma     as a child - no current med. or prob.  . Seasonal allergies    Past Surgical History  Procedure Laterality Date  . Cesarean section  05/14/2005; 11/19/2007  . Ganglion cyst excision  10/01/2011    Procedure: REMOVAL GANGLION OF WRIST;  Surgeon: Cammie Sickle., MD;  Location: [place];  Service: Orthopedics;  Laterality: Left;  left wrist   History reviewed. No pertinent family history. History  Substance Use Topics  . Smoking status: Never Smoker   . Smokeless tobacco: Never Used  . Alcohol Use: Yes     Comment: occasionally   OB History    No data available     Review of Systems  Constitutional: Negative for fever and chills.  HENT: Positive for dental problem. Negative for facial swelling and trouble swallowing.   Respiratory: Negative.   Cardiovascular: Negative.   Gastrointestinal: Positive for vomiting. Negative for abdominal pain.  Musculoskeletal: Negative.   Skin: Negative.    Neurological: Negative.       Allergies  Review of patient's allergies indicates no known allergies.  Home Medications   Prior to Admission medications   Medication Sig Start Date End Date Taking? Authorizing Provider  cephALEXin (KEFLEX) 500 MG capsule Take 1 capsule (500 mg total) by mouth 4 (four) times daily. Take all of medicine and drink lots of fluids 10/10/13   Billy Fischer, MD  ibuprofen (ADVIL,MOTRIN) 600 MG tablet Take 1 tablet (600 mg total) by mouth every 6 (six) hours as needed for pain. 06/10/13   April K Palumbo-Rasch, MD  traMADol (ULTRAM) 50 MG tablet Take 1 tablet (50 mg total) by mouth every 6 (six) hours as needed for pain. 06/10/13   April K Palumbo-Rasch, MD   BP 128/78 mmHg  Pulse 96  Temp(Src) 98.2 F (36.8 C) (Oral)  Resp 18  SpO2 100%  LMP 07/13/2014 Physical Exam  Constitutional: She is oriented to person, place, and time. She appears well-developed and well-nourished.  HENT:  Generally good dentition. Decay to lower left 2nd molar. No visualized abscess or drainage.   Neck: Normal range of motion.  Pulmonary/Chest: Effort normal.  Neurological: She is alert and oriented to person, place, and time.  Skin: Skin is warm and dry.    ED Course  Procedures (including critical care time) Labs Review Labs Reviewed - No data to display  Imaging Review No results found.   EKG Interpretation None  MDM   Final diagnoses:  None    1. Dental pain  Will cover with abx, pain management. She has dental follow up in place for 08/02/14.    Dewaine Oats, PA-C 07/27/14 2237  Ezequiel Essex, MD 07/28/14 [phone]

## 2014-07-27 NOTE — ED Notes (Signed)
Pt presents to ED with c/o lower left toothache, associated with nausea/vomiting/diarhea, and chills, onset this morning. No mouth swelling noted. Pt denies bloody vomit or stool. Pt alerts and oriented x4 at this time, airway intact.

## 2014-12-22 ENCOUNTER — Encounter (HOSPITAL_COMMUNITY): Payer: Self-pay | Admitting: *Deleted

## 2014-12-22 ENCOUNTER — Emergency Department (HOSPITAL_COMMUNITY)
Admission: EM | Admit: 2014-12-22 | Discharge: 2014-12-23 | Disposition: A | Discharge status: Home / Self Care | Payer: BC Managed Care – PPO | Attending: Emergency Medicine | Admitting: Emergency Medicine

## 2014-12-22 DIAGNOSIS — Z8739 Personal history of other diseases of the musculoskeletal system and connective tissue: Secondary | ICD-10-CM | POA: Diagnosis not present

## 2014-12-22 DIAGNOSIS — O99512 Diseases of the respiratory system complicating pregnancy, second trimester: Secondary | ICD-10-CM | POA: Diagnosis not present

## 2014-12-22 DIAGNOSIS — J45909 Unspecified asthma, uncomplicated: Secondary | ICD-10-CM | POA: Diagnosis not present

## 2014-12-22 DIAGNOSIS — O9989 Other specified diseases and conditions complicating pregnancy, childbirth and the puerperium: Secondary | ICD-10-CM | POA: Insufficient documentation

## 2014-12-22 DIAGNOSIS — R51 Headache: Secondary | ICD-10-CM | POA: Insufficient documentation

## 2014-12-22 DIAGNOSIS — Z3A16 16 weeks gestation of pregnancy: Secondary | ICD-10-CM | POA: Insufficient documentation

## 2014-12-22 DIAGNOSIS — Z8669 Personal history of other diseases of the nervous system and sense organs: Secondary | ICD-10-CM | POA: Insufficient documentation

## 2014-12-22 DIAGNOSIS — R519 Headache, unspecified: Secondary | ICD-10-CM

## 2014-12-22 LAB — COMPREHENSIVE METABOLIC PANEL
ALK PHOS: 76 U/L (ref 39–117)
ALT: 33 U/L (ref 0–35)
AST: 26 U/L (ref 0–37)
Albumin: 3.1 g/dL — ABNORMAL LOW (ref 3.5–5.2)
Anion gap: 9 (ref 5–15)
BUN: 7 mg/dL (ref 6–23)
CO2: 22 mmol/L (ref 19–32)
Calcium: 9 mg/dL (ref 8.4–10.5)
Chloride: 105 mmol/L (ref 96–112)
Creatinine, Ser: 0.61 mg/dL (ref 0.50–1.10)
GFR calc non Af Amer: >90 mL/min (ref >90)
GLUCOSE: 99 mg/dL (ref 70–99)
POTASSIUM: 3.3 mmol/L — AB (ref 3.5–5.1)
SODIUM: 136 mmol/L (ref 135–145)
Total Bilirubin: 0.5 mg/dL (ref 0.3–1.2)
Total Protein: 6.8 g/dL (ref 6.0–8.3)

## 2014-12-22 LAB — CBC WITH DIFFERENTIAL/PLATELET
Basophils Absolute: 0 10*3/uL (ref 0.0–0.1)
Basophils Relative: 0 % (ref 0–1)
Eosinophils Absolute: 0.1 10*3/uL (ref 0.0–0.7)
Eosinophils Relative: 2 % (ref 0–5)
HCT: 33.1 % — ABNORMAL LOW (ref 36.0–46.0)
Hemoglobin: 11.4 g/dL — ABNORMAL LOW (ref 12.0–15.0)
LYMPHS ABS: 1.9 10*3/uL (ref 0.7–4.0)
LYMPHS PCT: 41 % (ref 12–46)
MCH: 29.8 pg (ref 26.0–34.0)
MCHC: 34.4 g/dL (ref 30.0–36.0)
MCV: 86.6 fL (ref 78.0–100.0)
Monocytes Absolute: 0.3 10*3/uL (ref 0.1–1.0)
Monocytes Relative: 6 % (ref 3–12)
NEUTROS PCT: 51 % (ref 43–77)
Neutro Abs: 2.4 10*3/uL (ref 1.7–7.7)
PLATELETS: 205 10*3/uL (ref 150–400)
RBC: 3.82 MIL/uL — AB (ref 3.87–5.11)
RDW: 14.6 % (ref 11.5–15.5)
WBC: 4.7 10*3/uL (ref 4.0–10.5)

## 2014-12-22 LAB — URINALYSIS, ROUTINE W REFLEX MICROSCOPIC
Bilirubin Urine: NEGATIVE
Glucose, UA: NEGATIVE mg/dL
Hgb urine dipstick: NEGATIVE
Ketones, ur: NEGATIVE mg/dL
Nitrite: NEGATIVE
PROTEIN: NEGATIVE mg/dL
Specific Gravity, Urine: 1.011 (ref 1.005–1.030)
Urobilinogen, UA: 1 mg/dL (ref 0.0–1.0)
pH: 7.5 (ref 5.0–8.0)

## 2014-12-22 LAB — URINE MICROSCOPIC-ADD ON

## 2014-12-22 LAB — I-STAT BETA HCG BLOOD, ED (MC, WL, AP ONLY): I-stat hCG, quantitative: >2000 m[IU]/mL — ABNORMAL HIGH (ref <5)

## 2014-12-22 MED ORDER — POTASSIUM CHLORIDE CRYS ER 20 MEQ PO TBCR
40.0000 meq | EXTENDED_RELEASE_TABLET | Freq: Once | ORAL | Status: AC
  Administered 2014-12-22: 40 meq via ORAL
  Filled 2014-12-22: qty 2

## 2014-12-22 MED ORDER — METOCLOPRAMIDE HCL 10 MG PO TABS
10.0000 mg | ORAL_TABLET | Freq: Once | ORAL | Status: AC
  Administered 2014-12-22: 10 mg via ORAL
  Filled 2014-12-22: qty 1

## 2014-12-22 MED ORDER — ACETAMINOPHEN 500 MG PO TABS
1000.0000 mg | ORAL_TABLET | Freq: Once | ORAL | Status: AC
  Administered 2014-12-22: 1000 mg via ORAL
  Filled 2014-12-22: qty 2

## 2014-12-22 NOTE — ED Notes (Signed)
Pt in c/o headache for the last several days with n/v, states she is [redacted] weeks pregnant, denies abd pain or new vaginal discharge or bleeding, no distress noted

## 2014-12-22 NOTE — ED Provider Notes (Signed)
CSN: [id number]     Arrival date & time 12/22/14  2018 History  This chart was scribed for Everlene Balls, MD by Rayfield Citizen, ED Scribe. This patient was seen in room D32C/D32C and the patient's care was started at 11:03 PM.    Chief Complaint  Patient presents with  . Headache  . N/V    The history is provided by the patient. No language interpreter was used.     HPI Comments: [name] is a [redacted] weeks pregnant 34 y.o. female who presents to the Emergency Department complaining of three days of constant headache with associated nausea. She reports dizziness with movement of her head. She has taken 500mg  Tylenol without relief. Patient states she had the flu last week and finished her dose of Tamiflu 4/25; her headache began the following morning. She denies prior experience with headaches during pregnancy. She denies congestion, fevers, weakness or numbness to the extremities.   Past Medical History  Diagnosis Date  . PONV (postoperative nausea and vomiting)   . Spinal headache   . Ganglion cyst of wrist 08/2011    left  . Asthma     as a child - no current med. or prob.  . Seasonal allergies    Past Surgical History  Procedure Laterality Date  . Cesarean section  05/14/2005; 11/19/2007  . Ganglion cyst excision  10/01/2011    Procedure: REMOVAL GANGLION OF WRIST;  Surgeon: Cammie Sickle., MD;  Location: [place];  Service: Orthopedics;  Laterality: Left;  left wrist   History reviewed. No pertinent family history. History  Substance Use Topics  . Smoking status: Never Smoker   . Smokeless tobacco: Never Used  . Alcohol Use: Yes     Comment: occasionally   OB History    Gravida Para Term Preterm AB TAB SAB Ectopic Multiple Living   1              Review of Systems  All other systems reviewed and are negative.     Allergies  Review of patient's allergies indicates no known allergies.  Home Medications   Prior to Admission medications    Medication Sig Start Date End Date Taking? Authorizing Provider  acetaminophen (TYLENOL) 500 MG tablet Take 500 mg by mouth every 6 (six) hours as needed for mild pain.   Yes Historical Provider, MD  cephALEXin (KEFLEX) 500 MG capsule Take 1 capsule (500 mg total) by mouth 4 (four) times daily. Take all of medicine and drink lots of fluids Patient not taking: Reported on 12/22/2014 10/10/13   Billy Fischer, MD  HYDROcodone-acetaminophen (NORCO/VICODIN) 5-325 MG per tablet Take 1-2 tablets by mouth every 4 (four) hours as needed. Patient not taking: Reported on 12/22/2014 07/27/14   Charlann Lange, PA-C  ibuprofen (ADVIL,MOTRIN) 600 MG tablet Take 1 tablet (600 mg total) by mouth every 6 (six) hours as needed for pain. Patient not taking: Reported on 12/22/2014 06/10/13   April Palumbo, MD  penicillin v potassium (VEETID) 500 MG tablet Take 1 tablet (500 mg total) by mouth 3 (three) times daily. Patient not taking: Reported on 12/22/2014 07/27/14   Charlann Lange, PA-C  traMADol (ULTRAM) 50 MG tablet Take 1 tablet (50 mg total) by mouth every 6 (six) hours as needed for pain. Patient not taking: Reported on 12/22/2014 06/10/13   April Palumbo, MD   BP 139/79 mmHg  Pulse 82  Temp(Src) 98.2 F (36.8 C) (Oral)  Resp 24  SpO2 100%  LMP 09/02/2014 Physical Exam  Constitutional: She is oriented to person, place, and time. She appears well-developed and well-nourished. No distress.  HENT:  Head: Normocephalic and atraumatic.  Nose: Nose normal.  Mouth/Throat: Oropharynx is clear and moist. No oropharyngeal exudate.  Moist mucous membranes  Eyes: Conjunctivae and EOM are normal. Pupils are equal, round, and reactive to light. No scleral icterus.  Neck: Normal range of motion. Neck supple. No JVD present. No tracheal deviation present. No thyromegaly present.  Cardiovascular: Normal rate, regular rhythm and normal heart sounds.  Exam reveals no gallop and no friction rub.   No murmur  heard. Pulmonary/Chest: Effort normal and breath sounds normal. No respiratory distress. She has no wheezes. She has no rales. She exhibits no tenderness.  Abdominal: Soft. Bowel sounds are normal. She exhibits no distension and no mass. There is no tenderness. There is no rebound and no guarding.  Musculoskeletal: Normal range of motion. She exhibits no edema or tenderness.  Moves all extremities normally.   Lymphadenopathy:    She has no cervical adenopathy.  Neurological: She is alert and oriented to person, place, and time. She displays normal reflexes. No cranial nerve deficit. She exhibits normal muscle tone.  Normal strength and sensation in all extremities. Normal cerebellar testing.   Skin: Skin is warm and dry. No rash noted. No erythema. No pallor.  Psychiatric: She has a normal mood and affect. Her behavior is normal.  Nursing note and vitals reviewed.   ED Course  Procedures   DIAGNOSTIC STUDIES: Oxygen Saturation is 100% on RA, normal by my interpretation.    COORDINATION OF CARE: 11:10 PM Discussed treatment plan with pt at bedside and pt agreed to plan.   Labs Review Labs Reviewed  CBC WITH DIFFERENTIAL/PLATELET - Abnormal; Notable for the following:    RBC 3.82 (*)    Hemoglobin 11.4 (*)    HCT 33.1 (*)    All other components within normal limits  COMPREHENSIVE METABOLIC PANEL - Abnormal; Notable for the following:    Potassium 3.3 (*)    Albumin 3.1 (*)    All other components within normal limits  URINALYSIS, ROUTINE W REFLEX MICROSCOPIC - Abnormal; Notable for the following:    APPearance CLOUDY (*)    Leukocytes, UA LARGE (*)    All other components within normal limits  I-STAT BETA HCG BLOOD, ED ([facility], [facility], [facility] ONLY) - Abnormal; Notable for the following:    I-stat hCG, quantitative >2000.0 (*)    All other components within normal limits  URINE MICROSCOPIC-ADD ON    Imaging Review No results found.   EKG Interpretation None      MDM   Final  diagnoses:  None   Patient presents emergency department for headache over the past 3-4 days. Neurological exam here is normal. She denies any fevers or neurological symptoms. She also has some dizziness when she turns her head. I have low suspicion that this is a posterior circulation stroke. She was given 1 g of Tylenol and Reglan to help with her headache. She is advised that no other medications for headache or safe in pregnancy. Bedside ultrasound reveals live IUP. OB follow up advised within 3 days.  Her vital signs remain within her normal limits and she is safe for DC.  Will give Rx for reglan if it helps with headache.  Upon repeat evaluation the patient states her headache has currently subsided. She is requesting regular prescription to go home with. This will be provided for  her.  EMERGENCY DEPARTMENT [place] PREGNANCY "Study: Limited Ultrasound of the Pelvis"  INDICATIONS:Pregnancy(required) Multiple views of the uterus and pelvic cavity are obtained with a multi-frequency probe.  APPROACH:Transabdominal   PERFORMED BY: Myself  IMAGES ARCHIVED?: Yes  PREGNANCY FREE FLUID: Small  PREGNANCY UTERUS FINDINGS:Uterus normal size  PREGNANCY FINDINGS: Fetal heart activity seen  INTERPRETATION: Viable intrauterine pregnancy  FETAL HEART RATE: 167    I personally performed the services described in this documentation, which was scribed in my presence. The recorded information has been reviewed and is accurate.     Everlene Balls, MD 12/23/14 [phone]

## 2014-12-23 MED ORDER — METOCLOPRAMIDE HCL 10 MG PO TABS: 10.0000 mg | ORAL_TABLET | Freq: Three times a day (TID) | ORAL | Status: DC | PRN

## 2014-12-23 NOTE — Discharge Instructions (Signed)
Medicines During Pregnancy [name], take headache medication as prescribed and see your OB physician within 3 days for close follow-up. If any symptoms worsen come back to emergency department immediately. Thank you. During pregnancy, there are medicines that are either safe or unsafe to take. Medicines include prescriptions from your caregiver, over-the-counter medicines, topical creams applied to the skin, and all herbal substances. Medicines are put into either Class A, B, C, or D. Class A and B medicines have been shown to be safe in pregnancy. Class C medicines are also considered to be safe in pregnancy, but these medicines should only be used when necessary. Class D medicines should not be used at all in pregnancy. They can be harmful to a baby.  It is best to take as little medicine as possible while pregnant. However, some medicines are necessary to take for the mother and baby's health. Sometimes, it is more dangerous to stop taking certain medicines than to stay on them. This is often the case for people with long-term (chronic) conditions such as asthma, diabetes, or high blood pressure (hypertension). If you are pregnant and have a chronic illness, call your caregiver right away. Bring a list of your medicines and their doses to your appointments. If you are planning to become pregnant, schedule a doctor's appointment and discuss your medicines with your caregiver. Lastly, write down the phone number to your pharmacist. They can answer questions regarding a medicine's class and safety. They cannot give advice as to whether you should or should not be on a medicine.  SAFE AND UNSAFE MEDICINES There is a long list of medicines that are considered safe in pregnancy. Below is a shorter list. For specific medicines, ask your caregiver.  AllergyMedicines Loratadine, cetirizine, and chlorpheniramine are safe to take. Certain nasal steroid sprays are safe. Talk to your caregiver about specific  brands that are safe. Analgesics Acetaminophen and acetaminophen with codeine are safe to take. All other nonsteroidal anti-inflammatory drugs (NSAIDS) are not safe. This includes ibuprofen.  Antacids Many over-the-counter antacids are safe to take. Talk to your caregiver about specific brands that are safe. Famotidine, ranitidine, and lansoprazole are safe. Omepresole is considered safe to take in the second trimester. Antibiotic Medicines There are several antibiotics to avoid. These include, but are not limited to, tetracyline, quinolones, and sulfa medications. Talk to your caregiver before taking any antibiotic.  Antihistamines Talk to your caregiver about specific brands that are safe.  Asthma Medicines Most asthma steroid inhalers are safe to take. Talk to your caregiver for specific details. Calcium Calcium supplements are safe to take. Do not take oyster shell calcium.  Cough and Cold Medicines It is safe to take products with guaifenesin or dextromethorphan. Talk to your caregiver about specific brands that are safe. It is not safe to take products that contain aspirin or ibuprofen. Decongestant Medicines Pseudoephedrine-containing products are safe to take in the second and third trimester.  Depression Medicines Talk about these medicines with your caregiver.  Antidiarrheal Medicines It is safe to take loperamide. Talk to your caregiver about specific brands that are safe. It is not safe to take any antidiarrheal medicine that contains bismuth. Eyedrops Allergy eyedrops should be limited.  Iron It is safe to use certain iron-containing medicines for anemia in pregnancy. They require a prescription.  Antinausea Medicines It is safe to take doxylamine and vitamin B6 as directed. There are other prescription medicines available, if needed.  Sleep aids It is safe to take diphenhydramine and acetaminophen with  diphenhydramine.  Steroids Hydrocortisone creams are safe  to use as directed. Oral steroids require a prescription. It is not safe to take any hemorrhoid cream with pramoxine or phenylephrine. Stool softener It is safe to take stool softener medicines. Avoid daily or prolonged use of stool softeners. Thyroid Medicine It is important to stay on this thyroid medicine. It needs to be followed by your caregiver.  Vaginal Medicines Your caregiver will prescribe a medicine to you if you have a vaginal infection. Certain antifungal medicines are safe to use if you have a sexually transmitted infection (STI). Talk to your caregiver.  Document Released: 08/12/2005 Document Revised: 11/04/2011 Document Reviewed: 08/13/2011 [facility] Patient Information 2015 [place], [place]. This information is not intended to replace advice given to you by your health care provider. Make sure you discuss any questions you have with your health care provider. General Headache Without Cause A general headache is pain or discomfort felt around the head or neck area. The cause may not be found.  HOME CARE   Keep all doctor visits.  Only take medicines as told by your doctor.  Lie down in a dark, quiet room when you have a headache.  Keep a journal to find out if certain things bring on headaches. For example, write down:  What you eat and drink.  How much sleep you get.  Any change to your diet or medicines.  Relax by getting a massage or doing other relaxing activities.  Put ice or heat packs on the head and neck area as told by your doctor.  Lessen stress.  Sit up straight. Do not tighten (tense) your muscles.  Quit smoking if you smoke.  Lessen how much alcohol you drink.  Lessen how much caffeine you drink, or stop drinking caffeine.  Eat and sleep on a regular schedule.  Get 7 to 9 hours of sleep, or as told by your doctor.  Keep lights dim if bright lights bother you or make your headaches worse. GET HELP RIGHT AWAY IF:   Your headache becomes  really bad.  You have a fever.  You have a stiff neck.  You have trouble seeing.  Your muscles are weak, or you lose muscle control.  You lose your balance or have trouble walking.  You feel like you will pass out (faint), or you pass out.  You have really bad symptoms that are different than your first symptoms.  You have problems with the medicines given to you by your doctor.  Your medicines do not work.  Your headache feels different than the other headaches.  You feel sick to your stomach (nauseous) or throw up (vomit). MAKE SURE YOU:   Understand these instructions.  Will watch your condition.  Will get help right away if you are not doing well or get worse. Document Released: 05/21/2008 Document Revised: 11/04/2011 Document Reviewed: 08/02/2011 [facility] Patient Information 2015 [place], [place]. This information is not intended to replace advice given to you by your health care provider. Make sure you discuss any questions you have with your health care provider.

## 2014-12-23 NOTE — ED Notes (Signed)
Pt stable, ambulatory, pain decreased to 1/10, states understanding of discharge instructions

## 2015-03-21 ENCOUNTER — Encounter (HOSPITAL_COMMUNITY): Payer: Self-pay | Admitting: *Deleted

## 2015-03-21 ENCOUNTER — Inpatient Hospital Stay (HOSPITAL_COMMUNITY)
Admission: AD | Admit: 2015-03-21 | Discharge: 2015-03-21 | Disposition: A | Discharge status: Home / Self Care | Payer: BC Managed Care – PPO | Source: Ambulatory Visit | Attending: Obstetrics and Gynecology | Admitting: Obstetrics and Gynecology

## 2015-03-21 DIAGNOSIS — A5901 Trichomonal vulvovaginitis: Secondary | ICD-10-CM | POA: Insufficient documentation

## 2015-03-21 DIAGNOSIS — M549 Dorsalgia, unspecified: Secondary | ICD-10-CM | POA: Insufficient documentation

## 2015-03-21 DIAGNOSIS — O9989 Other specified diseases and conditions complicating pregnancy, childbirth and the puerperium: Secondary | ICD-10-CM

## 2015-03-21 DIAGNOSIS — Z3A29 29 weeks gestation of pregnancy: Secondary | ICD-10-CM | POA: Insufficient documentation

## 2015-03-21 DIAGNOSIS — O98313 Other infections with a predominantly sexual mode of transmission complicating pregnancy, third trimester: Secondary | ICD-10-CM | POA: Insufficient documentation

## 2015-03-21 DIAGNOSIS — A599 Trichomoniasis, unspecified: Secondary | ICD-10-CM | POA: Diagnosis not present

## 2015-03-21 DIAGNOSIS — R109 Unspecified abdominal pain: Secondary | ICD-10-CM | POA: Diagnosis present

## 2015-03-21 DIAGNOSIS — Z886 Allergy status to analgesic agent status: Secondary | ICD-10-CM | POA: Diagnosis not present

## 2015-03-21 LAB — URINALYSIS, ROUTINE W REFLEX MICROSCOPIC
BILIRUBIN URINE: NEGATIVE
Glucose, UA: NEGATIVE mg/dL
Ketones, ur: NEGATIVE mg/dL
NITRITE: NEGATIVE
PROTEIN: NEGATIVE mg/dL
Specific Gravity, Urine: 1.015 (ref 1.005–1.030)
UROBILINOGEN UA: 0.2 mg/dL (ref 0.0–1.0)
pH: 7 (ref 5.0–8.0)

## 2015-03-21 LAB — URINE MICROSCOPIC-ADD ON

## 2015-03-21 MED ORDER — METRONIDAZOLE 500 MG PO TABS
2000.0000 mg | ORAL_TABLET | Freq: Once | ORAL | Status: AC
  Administered 2015-03-21: 2000 mg via ORAL
  Filled 2015-03-21: qty 4

## 2015-03-21 NOTE — MAU Provider Note (Signed)
History     CSN: [id number]  Arrival date and time: 03/21/15 2151   First Provider Initiated Contact with Patient 03/21/15 2226      No chief complaint on file.  HPI  Ms. Aevah Stansbery is a 34 y.o. [id number] at [redacted]w[redacted]d who presents to MAU today with complaint of pressure and cramping. The patient states this started yesterday and was intermittent. Today it has been more constant. She also states low back pain. She rates her pain at 5/10 now. She took Tylenol 500 mg this evening without relief. She also noted a small amount of clumpy pink-brown discharge earlier today. She has not seen any bright red bleeding. She denies LOF. She reports good fetal movement and only occasional, mild contractions.   OB History    Gravida Para Term Preterm AB TAB SAB Ectopic Multiple Living   5 0   1 1    3       Past Medical History  Diagnosis Date  . PONV (postoperative nausea and vomiting)   . Spinal headache   . Ganglion cyst of wrist 08/2011    left  . Asthma     as a child - no current med. or prob.  . Seasonal allergies     Past Surgical History  Procedure Laterality Date  . Cesarean section  05/14/2005; 11/19/2007  . Ganglion cyst excision  10/01/2011    Procedure: REMOVAL GANGLION OF WRIST;  Surgeon: Cammie Sickle., MD;  Location: [facility];  Service: Orthopedics;  Laterality: Left;  left wrist    History reviewed. No pertinent family history.  History  Substance Use Topics  . Smoking status: Never Smoker   . Smokeless tobacco: Never Used  . Alcohol Use: Yes     Comment: occasionally    Allergies: No Known Allergies  Prescriptions prior to admission  Medication Sig Dispense Refill Last Dose  . acetaminophen (TYLENOL) 500 MG tablet Take 500 mg by mouth every 6 (six) hours as needed for mild pain.   12/22/2014 at Unknown time  . metoCLOPramide (REGLAN) 10 MG tablet Take 1 tablet (10 mg total) by mouth every 8 (eight) hours as needed (headache and nausea). 15  tablet 0     Review of Systems  Constitutional: Negative for fever and malaise/fatigue.  Gastrointestinal: Positive for abdominal pain. Negative for nausea, vomiting, diarrhea and constipation.  Genitourinary: Negative for dysuria, urgency and frequency.       Neg - vaginal discharge, LOF + vaginal bleeding   Physical Exam   Blood pressure 111/74, pulse 96, temperature 98.4 F (36.9 C), temperature source Oral, resp. rate 20, height 5\' 3"  (1.6 m), weight 148 lb 4 oz (67.246 kg), last menstrual period 09/02/2014.  Physical Exam  Nursing note and vitals reviewed. Constitutional: She is oriented to person, place, and time. She appears well-developed and well-nourished. No distress.  HENT:  Head: Normocephalic and atraumatic.  Cardiovascular: Normal rate.   Respiratory: Effort normal.  GI: Soft. She exhibits no distension and no mass. There is no tenderness. There is no rebound and no guarding.  Neurological: She is alert and oriented to person, place, and time.  Skin: Skin is warm and dry. No erythema.  Psychiatric: She has a normal mood and affect.  Dilation: Fingertip Effacement (%): Thick Cervical Position: Posterior Exam by:: J.Wenzle,PA  Results for orders placed or performed during the hospital encounter of 03/21/15 (from the past 24 hour(s))  Urinalysis, Routine w reflex microscopic (not at [facility])  Status: Abnormal   Collection Time: 03/21/15  9:53 PM  Result Value Ref Range   Color, Urine YELLOW YELLOW   APPearance CLEAR CLEAR   Specific Gravity, Urine 1.015 1.005 - 1.030   pH 7.0 5.0 - 8.0   Glucose, UA NEGATIVE NEGATIVE mg/dL   Hgb urine dipstick SMALL (A) NEGATIVE   Bilirubin Urine NEGATIVE NEGATIVE   Ketones, ur NEGATIVE NEGATIVE mg/dL   Protein, ur NEGATIVE NEGATIVE mg/dL   Urobilinogen, UA 0.2 0.0 - 1.0 mg/dL   Nitrite NEGATIVE NEGATIVE   Leukocytes, UA LARGE (A) NEGATIVE  Urine microscopic-add on     Status: Abnormal   Collection Time: 03/21/15  9:53 PM   Result Value Ref Range   Squamous Epithelial / LPF FEW (A) RARE   WBC, UA 7-10 <3 WBC/hpf   Bacteria, UA FEW (A) RARE   Urine-Other TRICHOMONAS PRESENT     Fetal Monitoring: Baseline: 130 bpm, moderate variability, + accelerations, no decelerations Contractions: none  MAU Course  Procedures None  MDM UA today shows no signs of dehydration, but is + trichomonas Discussed patient with Dr. Terri Piedra. Agrees with plan for treatment with Flagyl and discharge at this time. Follow-up as scheduled.  2 G Flagyl given in MAU Assessment and Plan  A: [facility]UP at [redacted]w[redacted]d Trichomonas infection Back pain in pregnancy  P: Discharge home Patient treated in MAU. Partner treatment advised Preterm labor precautions discussed Patient advised to follow-up with [facility] as scheduled for routine prenatal care Patient may return to MAU as needed or if her condition were to change or worsen   Luvenia Redden, PA-C  03/21/2015, 10:44 PM

## 2015-03-21 NOTE — Discharge Instructions (Signed)

## 2015-03-21 NOTE — MAU Note (Signed)
PT SAYS SHE STARTED  CRAMPING   IN LOWER  ABD  YESTERDAY  AND  TODAY  MORE -  IN LOWER  BACK .Marland Kitchen  TOOK TYLENOL  AT 830PM-  500MG    - NO RELIEF     PT  CALLED  DR  - TOLD  TO COME IN.   NO VE IN OFFICE .  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    DENIES  ANY S/S  NOW.    DENIES MRSA.   LAST SEX-   SAT.

## 2015-03-29 ENCOUNTER — Encounter: Payer: BC Managed Care – PPO | Attending: Obstetrics and Gynecology

## 2015-03-29 VITALS — Ht 63.0 in | Wt 147.5 lb

## 2015-03-29 DIAGNOSIS — Z713 Dietary counseling and surveillance: Secondary | ICD-10-CM | POA: Diagnosis not present

## 2015-03-29 DIAGNOSIS — O24419 Gestational diabetes mellitus in pregnancy, unspecified control: Secondary | ICD-10-CM | POA: Diagnosis not present

## 2015-03-29 NOTE — Progress Notes (Signed)
  Patient was seen on 03/29/15 for Gestational Diabetes self-management . The following learning objectives were met by the patient :   States the definition of Gestational Diabetes  States why dietary management is important in controlling blood glucose  Describes the effects of carbohydrates on blood glucose levels  Demonstrates ability to create a balanced meal plan  Demonstrates carbohydrate counting   States when to check blood glucose levels  Demonstrates proper blood glucose monitoring techniques  States the effect of stress and exercise on blood glucose levels  States the importance of limiting caffeine and abstaining from alcohol and smoking  Plan:  Aim for 2 Carb Choices per meal (30 grams) +/- 1 either way for breakfast Aim for 3 Carb Choices per meal (45 grams) +/- 1 either way from lunch and dinner Aim for 1-2 Carbs per snack Begin reading food labels for Total Carbohydrate and sugar grams of foods Consider  increasing your activity level by walking daily as tolerated Begin checking BG before breakfast and 1 hours after first bit of breakfast, lunch and dinner after  as directed by MD  Take medication  as directed by MD  Blood glucose monitor given: One Touch Verio Flex Lot # [id number] X Exp: 03/2016 Blood glucose reading: 141m/dl  Patient instructed to monitor glucose levels: FBS: 60 - <90 1 hour: <140  Patient received the following handouts:  Nutrition Diabetes and Pregnancy  Carbohydrate Counting List  Meal Planning worksheet  Patient will be seen for follow-up as needed.

## 2015-04-20 LAB — OB RESULTS CONSOLE HIV ANTIBODY (ROUTINE TESTING): HIV: NONREACTIVE

## 2015-05-30 ENCOUNTER — Other Ambulatory Visit: Payer: Self-pay | Admitting: Obstetrics and Gynecology

## 2015-05-30 ENCOUNTER — Encounter (HOSPITAL_COMMUNITY): Payer: Self-pay

## 2015-05-30 ENCOUNTER — Encounter (HOSPITAL_COMMUNITY)
Admission: RE | Admit: 2015-05-30 | Discharge: 2015-05-30 | Disposition: A | Discharge status: Home / Self Care | Payer: BC Managed Care – PPO | Source: Ambulatory Visit | Attending: Obstetrics and Gynecology | Admitting: Obstetrics and Gynecology

## 2015-05-30 HISTORY — DX: Herpesviral infection, unspecified: B00.9

## 2015-05-30 LAB — COMPREHENSIVE METABOLIC PANEL
ALBUMIN: 2.8 g/dL — AB (ref 3.5–5.0)
ALT: 16 U/L (ref 14–54)
AST: 20 U/L (ref 15–41)
Alkaline Phosphatase: 255 U/L — ABNORMAL HIGH (ref 38–126)
Anion gap: 7 (ref 5–15)
BUN: 9 mg/dL (ref 6–20)
CHLORIDE: 107 mmol/L (ref 101–111)
CO2: 21 mmol/L — AB (ref 22–32)
CREATININE: 0.75 mg/dL (ref 0.44–1.00)
Calcium: 8.7 mg/dL — ABNORMAL LOW (ref 8.9–10.3)
GFR calc Af Amer: >60 mL/min (ref >60)
GFR calc non Af Amer: >60 mL/min (ref >60)
Glucose, Bld: 144 mg/dL — ABNORMAL HIGH (ref 65–99)
Potassium: 3.3 mmol/L — ABNORMAL LOW (ref 3.5–5.1)
SODIUM: 135 mmol/L (ref 135–145)
Total Bilirubin: 0.6 mg/dL (ref 0.3–1.2)
Total Protein: 6.9 g/dL (ref 6.5–8.1)

## 2015-05-30 LAB — CBC WITH DIFFERENTIAL/PLATELET
BASOS PCT: 0 %
Basophils Absolute: 0 10*3/uL (ref 0.0–0.1)
EOS PCT: 0 %
Eosinophils Absolute: 0 10*3/uL (ref 0.0–0.7)
HEMATOCRIT: 33.1 % — AB (ref 36.0–46.0)
Hemoglobin: 11.1 g/dL — ABNORMAL LOW (ref 12.0–15.0)
Lymphocytes Relative: 36 %
Lymphs Abs: 1.4 10*3/uL (ref 0.7–4.0)
MCH: 30 pg (ref 26.0–34.0)
MCHC: 33.5 g/dL (ref 30.0–36.0)
MCV: 89.5 fL (ref 78.0–100.0)
MONO ABS: 0.2 10*3/uL (ref 0.1–1.0)
MONOS PCT: 5 %
NEUTROS ABS: 2.3 10*3/uL (ref 1.7–7.7)
Neutrophils Relative %: 59 %
PLATELETS: 154 10*3/uL (ref 150–400)
RBC: 3.7 MIL/uL — ABNORMAL LOW (ref 3.87–5.11)
RDW: 15.4 % (ref 11.5–15.5)
WBC: 3.9 10*3/uL — ABNORMAL LOW (ref 4.0–10.5)

## 2015-05-30 LAB — TYPE AND SCREEN
ABO/RH(D): A POS
Antibody Screen: NEGATIVE

## 2015-05-30 LAB — ABO/RH: ABO/RH(D): A POS

## 2015-05-30 NOTE — H&P (Deleted)
[name], [name]            ACCOUNT NO.:  [id number]  MEDICAL RECORD NO.:  [id number]  LOCATION:  [place]                           FACILITY:  [facility]  PHYSICIAN:  Lucille Passy. [name], M.D. DATE OF BIRTH:  [birth year]  DATE OF ADMISSION:  05/30/2015 DATE OF DISCHARGE:                             HISTORY & PHYSICAL   PRESENT ILLNESS:  This is a 34 year old black female, para 2-1-1-3, gravida 5, EDC on June 06, 2015, admitted for repeat C-section after having had 2 prior C-sections.  Her initial ultrasound was at 6 weeks and 2 days.  Another ultrasound at 9 weeks 6 days, placed her due date is June 06, 2015.  Her last delivery was at 36 weeks and 5 days.  She declined progesterone, who wanted screening test.  She has a history of herpes and she had 2 C-sections.  Due date was confirmed as June 06, 2015.  Blood group and type A positive with a negative antibody. Hepatitis B surface antigen negative, GC and Chlamydia negative. Varicella immune, Rubella.  Hemoglobin electrophoresis was AA.  First trimester screen was negative.  AFP was negative.  One-hour Glucola was 155.  Three-hour GTT 87, 197, 157, and 145.  Group B strep was positive. Repeat GC and Chlamydia negative.  Repeat HIV and RPR negative.  The patient began her prenatal course early.  She declined cystic fibrosis screen.  She was encouraged to gain weight after her initial 17 weeks, had no weight gain.  [facility] is scheduled [facility], kept her blood sugars, occasional blood sugars were abnormal, and so she was begun on non-stress test, although she was not considered abnormal enough to take medication.  Non-stress tests were reactive, including 1 on the day prior to admission.  Blood pressure remained normal throughout pregnancy.  Her initial weight was 137, final weight on the day prior to admission was 151.8.  She wants tubal ligation at the time of delivery and I told her if I could easily  reach the end of the tubes, I would do a bilateral distal salpingectomy, otherwise, I would do a tubal ligation at the midportion of the tube.  I explained to her the reason for wanting to do the bilateral distal salpingectomy.  She apparently does not have any major medical illnesses.  SURGICAL HISTORY:  Two C sections, and removal of a wrist tendon ganglion cyst in 2013.  She does have a history of Trichomonas.  She also has a history of herpes and history of chlamydia.  ALLERGIES:  No known drug allergies.  No latex allergies.  SOCIAL HISTORY:  The patient never smoke, drank occasionally prior to pregnancy.  Is single.  FAMILY HISTORY:  A son and daughter with asthma and mother has high blood pressure.  PHYSICAL EXAMINATION:  GENERAL:  On admission, well-developed well- nourished black female, in no distress. VITAL SIGNS:  Blood pressure 110/80, pulse of 80. HEART:  Normal size and sounds.  No murmurs. LUNGS:  Clear to auscultation. ABDOMEN:  Soft.  Fundal height 38 cm.  There is a low-transverse scar. I have told her that I would go through the old scar.  The patient did not want  pelvic exam on the day of admission.  Her last exam on May 25, 2015, the cervix was closed, long, and the vertex was at - 4 station.  ADMITTING IMPRESSION:  Intrauterine pregnancy at 39 weeks and 1 day. Two prior C-sections.  Voluntary sterilization.  Because of her prior history of herpes, she was instructed to take Valtrex and she is admitted for surgery.     Lucille Passy. [name], M.D.     TFH/MEDQ  D:  05/30/2015  T:  05/30/2015  Job:  [id number]

## 2015-05-30 NOTE — Patient Instructions (Addendum)
Your procedure is scheduled on:  Wednesday, May 31, 2015  Enter through the [employer] [facility] at: 11:00 A.M.  Pick up the phone at the desk and dial [phone].  Call this number if you have problems the morning of surgery: [phone].  Remember: Do NOT eat food:  After midnight tonight Do NOT drink clear liquids after: After 8:30 am day of surgery Take these medicines the morning of surgery with a SIP OF WATER: None  Do NOT wear jewelry (body piercing), metal hair clips/bobby pins, or nail polish. Do NOT wear lotions, powders, or perfumes.  You may wear deoderant. Do NOT shave for 48 hours prior to surgery. Do NOT bring valuables to the hospital.  Leave suitcase in car.  After surgery it may be brought to your room.  For patients admitted to the hospital, checkout time is 11:00 AM the day of discharge.

## 2015-05-30 NOTE — H&P (Signed)
NAMEJOURDYN, [name]            ACCOUNT NO.:  [id number]  MEDICAL RECORD NO.:  [id number]  LOCATION:  9127                          FACILITY:  [facility]  PHYSICIAN:  Lucille Passy. [name], M.D. DATE OF BIRTH:  Aug 06, 1981  DATE OF ADMISSION:  05/31/2015 DATE OF DISCHARGE:  06/02/2015                             HISTORY & PHYSICAL   PRESENT ILLNESS:  This is a 34 year old black female, para 2-1-1-3, gravida 5, EDC on June 06, 2015, admitted for repeat C-section after having had 2 prior C-sections.  Her initial ultrasound was at 6 weeks and 2 days.  Another ultrasound at 9 weeks 6 days, placed her due date is June 06, 2015.  Her last delivery was at 36 weeks and 5 days.  She declined progesterone, who wanted screening test.  She has a history of herpes and she had 2 C-sections.  Due date was confirmed as June 06, 2015.  Blood group and type A positive with a negative antibody. Hepatitis B surface antigen negative, GC and Chlamydia negative. Varicella immune, Rubella.  Hemoglobin electrophoresis was AA.  First trimester screen was negative.  AFP was negative.  One-hour Glucola was 155.  Three-hour GTT 87, 197, 157, and 145.  Group B strep was positive. Repeat GC and Chlamydia negative.  Repeat HIV and RPR negative.  The patient began her prenatal course early.  She declined cystic fibrosis screen.  She was encouraged to gain weight after her initial 17 weeks, had no weight gain.  She is scheduled and [facility], kept her blood sugars, occasional blood sugars were abnormal, and so she was begun on non-stress test, although she was not considered abnormal enough to take medication.  Non-stress tests were reactive, including 1 on the day prior to admission.  Blood pressure remained normal throughout pregnancy.  Her initial weight was 137, final weight on the day prior to admission was 151.8.  She wants tubal ligation at the time of delivery and I told her if I  could easily reach the end of the tubes, I would do a bilateral distal salpingectomy, otherwise, I would do a tubal ligation at the midportion of the tube.  I explained to her the reason for wanting to do the bilateral distal salpingectomy.  She apparently does not have any major medical illnesses.  SURGICAL HISTORY:  Two C sections, and removal of a wrist tendon ganglion cyst in 2013.  She does have a history of Trichomonas.  She also has a history of herpes and history of chlamydia.  ALLERGIES:  No known drug allergies.  No latex allergies.  SOCIAL HISTORY:  The patient never smoke, drank occasionally prior to pregnancy.  Is single.  FAMILY HISTORY:  A son and daughter with asthma and mother has high blood pressure.  PHYSICAL EXAMINATION:  GENERAL:  On admission, well-developed well- nourished black female, in no distress. VITAL SIGNS:  Blood pressure 110/80, pulse of 80. HEART:  Normal size and sounds.  No murmurs. LUNGS:  Clear to auscultation. ABDOMEN:  Soft.  Fundal height 38 cm.  There is a low-transverse scar. I have told her that I would go through the old scar.  The patient did not  want pelvic exam on the day of admission.  Her last exam on May 25, 2015, the cervix was closed, long, and the vertex was at - 4 station.  ADMITTING IMPRESSION:  Intrauterine pregnancy at 39 weeks and 1 day. Two prior C-sections.  Voluntary sterilization.  Because of her prior history of herpes, she was instructed to take Valtrex and she is admitted for surgery.     Lucille Passy. [name], M.D.     TFH/MEDQ  D:  05/30/2015  T:  05/30/2015  Job:  [id number]

## 2015-05-31 ENCOUNTER — Inpatient Hospital Stay (HOSPITAL_COMMUNITY): Payer: BC Managed Care – PPO | Admitting: Certified Registered Nurse Anesthetist

## 2015-05-31 ENCOUNTER — Encounter (HOSPITAL_COMMUNITY): Payer: Self-pay

## 2015-05-31 ENCOUNTER — Encounter (HOSPITAL_COMMUNITY)
Admission: RE | Disposition: A | Discharge status: Home / Self Care | Payer: Self-pay | Source: Ambulatory Visit | Attending: Obstetrics and Gynecology

## 2015-05-31 ENCOUNTER — Inpatient Hospital Stay (HOSPITAL_COMMUNITY)
Admission: RE | Admit: 2015-05-31 | Discharge: 2015-06-02 | DRG: 765 | Disposition: A | Discharge status: Home / Self Care | Payer: BC Managed Care – PPO | Source: Ambulatory Visit | Attending: Obstetrics and Gynecology | Admitting: Obstetrics and Gynecology

## 2015-05-31 DIAGNOSIS — O34211 Maternal care for low transverse scar from previous cesarean delivery: Principal | ICD-10-CM | POA: Diagnosis present

## 2015-05-31 DIAGNOSIS — Z302 Encounter for sterilization: Secondary | ICD-10-CM

## 2015-05-31 DIAGNOSIS — Z3A39 39 weeks gestation of pregnancy: Secondary | ICD-10-CM

## 2015-05-31 DIAGNOSIS — O2492 Unspecified diabetes mellitus in childbirth: Secondary | ICD-10-CM | POA: Diagnosis present

## 2015-05-31 DIAGNOSIS — O99824 Streptococcus B carrier state complicating childbirth: Secondary | ICD-10-CM | POA: Diagnosis present

## 2015-05-31 DIAGNOSIS — Z98891 History of uterine scar from previous surgery: Secondary | ICD-10-CM

## 2015-05-31 LAB — GLUCOSE, CAPILLARY: GLUCOSE-CAPILLARY: 71 mg/dL (ref 65–99)

## 2015-05-31 LAB — RPR: RPR: NONREACTIVE

## 2015-05-31 SURGERY — Surgical Case
Anesthesia: Spinal | Laterality: Bilateral

## 2015-05-31 MED ORDER — ACETAMINOPHEN 325 MG PO TABS: 650.0000 mg | ORAL_TABLET | ORAL | Status: DC | PRN

## 2015-05-31 MED ORDER — KETOROLAC TROMETHAMINE 30 MG/ML IJ SOLN
30.0000 mg | Freq: Four times a day (QID) | INTRAMUSCULAR | Status: DC | PRN
  Administered 2015-05-31: 30 mg via INTRAMUSCULAR

## 2015-05-31 MED ORDER — MORPHINE SULFATE (PF) 0.5 MG/ML IJ SOLN
INTRAMUSCULAR | Status: AC
  Filled 2015-05-31: qty 100

## 2015-05-31 MED ORDER — LACTATED RINGERS IV SOLN
INTRAVENOUS | Status: DC | PRN
  Administered 2015-05-31: 13:00:00 via INTRAVENOUS

## 2015-05-31 MED ORDER — LACTATED RINGERS IV SOLN
INTRAVENOUS | Status: DC
  Administered 2015-05-31 – 2015-06-01 (×2): via INTRAVENOUS

## 2015-05-31 MED ORDER — BUPIVACAINE IN DEXTROSE 0.75-8.25 % IT SOLN
INTRATHECAL | Status: DC | PRN
Start: 2015-05-31 — End: 2015-05-31
  Administered 2015-05-31: 1.2 mL via INTRATHECAL

## 2015-05-31 MED ORDER — ZOLPIDEM TARTRATE 5 MG PO TABS: 5.0000 mg | ORAL_TABLET | Freq: Every evening | ORAL | Status: DC | PRN

## 2015-05-31 MED ORDER — FERROUS SULFATE 325 (65 FE) MG PO TABS
325.0000 mg | ORAL_TABLET | Freq: Every day | ORAL | Status: DC
  Administered 2015-06-01 – 2015-06-02 (×2): 325 mg via ORAL
  Filled 2015-05-31 (×2): qty 1

## 2015-05-31 MED ORDER — TETANUS-DIPHTH-ACELL PERTUSSIS 5-2.5-18.5 LF-MCG/0.5 IM SUSP
0.5000 mL | Freq: Once | INTRAMUSCULAR | Status: DC
Start: 2015-06-01 — End: 2015-06-02

## 2015-05-31 MED ORDER — LACTATED RINGERS IV SOLN
INTRAVENOUS | Status: DC
  Administered 2015-05-31: 125 mL/h via INTRAVENOUS
  Administered 2015-05-31: 13:00:00 via INTRAVENOUS

## 2015-05-31 MED ORDER — KETOROLAC TROMETHAMINE 30 MG/ML IJ SOLN
INTRAMUSCULAR | Status: AC
  Filled 2015-05-31: qty 1

## 2015-05-31 MED ORDER — SIMETHICONE 80 MG PO CHEW: 80.0000 mg | CHEWABLE_TABLET | Freq: Three times a day (TID) | ORAL | Status: DC

## 2015-05-31 MED ORDER — PRENATAL VITAMINS PLUS 27-1 MG PO TABS: ORAL_TABLET | Freq: Every day | ORAL | Status: DC

## 2015-05-31 MED ORDER — OXYCODONE-ACETAMINOPHEN 5-325 MG PO TABS: 1.0000 | ORAL_TABLET | ORAL | Status: DC | PRN

## 2015-05-31 MED ORDER — LANOLIN HYDROUS EX OINT: 1.0000 "application " | TOPICAL_OINTMENT | CUTANEOUS | Status: DC | PRN

## 2015-05-31 MED ORDER — IBUPROFEN 600 MG PO TABS
600.0000 mg | ORAL_TABLET | Freq: Four times a day (QID) | ORAL | Status: DC | PRN
  Administered 2015-06-01: 600 mg via ORAL

## 2015-05-31 MED ORDER — PHENYLEPHRINE 8 MG IN D5W 100 ML (0.08MG/ML) PREMIX OPTIME
INJECTION | INTRAVENOUS | Status: DC | PRN
  Administered 2015-05-31: 60 ug/min via INTRAVENOUS

## 2015-05-31 MED ORDER — OXYCODONE-ACETAMINOPHEN 5-325 MG PO TABS: 2.0000 | ORAL_TABLET | ORAL | Status: DC | PRN

## 2015-05-31 MED ORDER — SENNOSIDES-DOCUSATE SODIUM 8.6-50 MG PO TABS
2.0000 | ORAL_TABLET | ORAL | Status: DC
  Administered 2015-06-01 – 2015-06-02 (×2): 2 via ORAL
  Filled 2015-05-31 (×2): qty 2

## 2015-05-31 MED ORDER — SIMETHICONE 80 MG PO CHEW
80.0000 mg | CHEWABLE_TABLET | ORAL | Status: DC
  Administered 2015-06-01 – 2015-06-02 (×2): 80 mg via ORAL
  Filled 2015-05-31 (×2): qty 1

## 2015-05-31 MED ORDER — ACETAMINOPHEN 325 MG PO TABS
650.0000 mg | ORAL_TABLET | ORAL | Status: DC | PRN
  Administered 2015-06-01 – 2015-06-02 (×2): 650 mg via ORAL
  Filled 2015-05-31 (×2): qty 2

## 2015-05-31 MED ORDER — IBUPROFEN 600 MG PO TABS: 600.0000 mg | ORAL_TABLET | Freq: Four times a day (QID) | ORAL | Status: DC

## 2015-05-31 MED ORDER — IBUPROFEN 600 MG PO TABS
600.0000 mg | ORAL_TABLET | Freq: Four times a day (QID) | ORAL | Status: DC
  Administered 2015-06-01 – 2015-06-02 (×5): 600 mg via ORAL
  Filled 2015-05-31 (×6): qty 1

## 2015-05-31 MED ORDER — FENTANYL CITRATE (PF) 100 MCG/2ML IJ SOLN
INTRAMUSCULAR | Status: DC | PRN
  Administered 2015-05-31: 20 ug via INTRAVENOUS

## 2015-05-31 MED ORDER — SIMETHICONE 80 MG PO CHEW: 80.0000 mg | CHEWABLE_TABLET | ORAL | Status: DC

## 2015-05-31 MED ORDER — DIPHENHYDRAMINE HCL 25 MG PO CAPS
25.0000 mg | ORAL_CAPSULE | Freq: Four times a day (QID) | ORAL | Status: DC | PRN
Start: 2015-05-31 — End: 2015-05-31

## 2015-05-31 MED ORDER — SCOPOLAMINE 1 MG/3DAYS TD PT72
MEDICATED_PATCH | TRANSDERMAL | Status: AC
  Administered 2015-05-31: 1.5 mg via TRANSDERMAL
  Filled 2015-05-31: qty 1

## 2015-05-31 MED ORDER — SIMETHICONE 80 MG PO CHEW
80.0000 mg | CHEWABLE_TABLET | Freq: Three times a day (TID) | ORAL | Status: DC
  Administered 2015-06-01 – 2015-06-02 (×3): 80 mg via ORAL
  Filled 2015-05-31 (×4): qty 1

## 2015-05-31 MED ORDER — MORPHINE SULFATE (PF) 0.5 MG/ML IJ SOLN
INTRAMUSCULAR | Status: DC | PRN
  Administered 2015-05-31: .2 mg via EPIDURAL

## 2015-05-31 MED ORDER — DEXTROSE 5 % IV SOLN
1.0000 ug/kg/h | INTRAVENOUS | Status: DC | PRN
  Filled 2015-05-31: qty 2

## 2015-05-31 MED ORDER — MENTHOL 3 MG MT LOZG
1.0000 | LOZENGE | OROMUCOSAL | Status: DC | PRN
Start: 2015-05-31 — End: 2015-06-02

## 2015-05-31 MED ORDER — OXYTOCIN 40 UNITS IN LACTATED RINGERS INFUSION - SIMPLE MED: 62.5000 mL/h | INTRAVENOUS | Status: AC

## 2015-05-31 MED ORDER — SODIUM CHLORIDE 0.9 % IJ SOLN: 3.0000 mL | INTRAMUSCULAR | Status: DC | PRN

## 2015-05-31 MED ORDER — SIMETHICONE 80 MG PO CHEW: 80.0000 mg | CHEWABLE_TABLET | ORAL | Status: DC | PRN

## 2015-05-31 MED ORDER — 0.9 % SODIUM CHLORIDE (POUR BTL) OPTIME
TOPICAL | Status: DC | PRN
  Administered 2015-05-31: 1000 mL

## 2015-05-31 MED ORDER — MEASLES, MUMPS & RUBELLA VAC ~~LOC~~ INJ
0.5000 mL | INJECTION | Freq: Once | SUBCUTANEOUS | Status: DC
  Filled 2015-05-31: qty 0.5

## 2015-05-31 MED ORDER — SENNOSIDES-DOCUSATE SODIUM 8.6-50 MG PO TABS: 2.0000 | ORAL_TABLET | ORAL | Status: DC

## 2015-05-31 MED ORDER — CEFAZOLIN SODIUM-DEXTROSE 2-3 GM-% IV SOLR
INTRAVENOUS | Status: AC
  Filled 2015-05-31: qty 50

## 2015-05-31 MED ORDER — NALBUPHINE HCL 10 MG/ML IJ SOLN: 5.0000 mg | INTRAMUSCULAR | Status: DC | PRN

## 2015-05-31 MED ORDER — PHENYLEPHRINE 8 MG IN D5W 100 ML (0.08MG/ML) PREMIX OPTIME
INJECTION | INTRAVENOUS | Status: AC
  Filled 2015-05-31: qty 100

## 2015-05-31 MED ORDER — KETOROLAC TROMETHAMINE 30 MG/ML IJ SOLN: 30.0000 mg | Freq: Four times a day (QID) | INTRAMUSCULAR | Status: AC | PRN

## 2015-05-31 MED ORDER — ONDANSETRON HCL 4 MG/2ML IJ SOLN
INTRAMUSCULAR | Status: DC | PRN
  Administered 2015-05-31: 4 mg via INTRAVENOUS

## 2015-05-31 MED ORDER — DIBUCAINE 1 % RE OINT: 1.0000 "application " | TOPICAL_OINTMENT | RECTAL | Status: DC | PRN

## 2015-05-31 MED ORDER — PRENATAL MULTIVITAMIN CH
1.0000 | ORAL_TABLET | Freq: Every day | ORAL | Status: DC
  Administered 2015-06-01: 1 via ORAL
  Filled 2015-05-31: qty 1

## 2015-05-31 MED ORDER — CEFAZOLIN SODIUM-DEXTROSE 2-3 GM-% IV SOLR
2.0000 g | Freq: Three times a day (TID) | INTRAVENOUS | Status: AC
  Administered 2015-05-31 – 2015-06-01 (×2): 2 g via INTRAVENOUS
  Filled 2015-05-31 (×2): qty 50

## 2015-05-31 MED ORDER — DIPHENHYDRAMINE HCL 25 MG PO CAPS: 25.0000 mg | ORAL_CAPSULE | Freq: Four times a day (QID) | ORAL | Status: DC | PRN

## 2015-05-31 MED ORDER — FENTANYL CITRATE (PF) 100 MCG/2ML IJ SOLN
INTRAMUSCULAR | Status: AC
  Filled 2015-05-31: qty 4

## 2015-05-31 MED ORDER — SCOPOLAMINE 1 MG/3DAYS TD PT72
1.0000 | MEDICATED_PATCH | Freq: Once | TRANSDERMAL | Status: DC
  Administered 2015-05-31: 1.5 mg via TRANSDERMAL

## 2015-05-31 MED ORDER — MEPERIDINE HCL 25 MG/ML IJ SOLN: 6.2500 mg | INTRAMUSCULAR | Status: DC | PRN

## 2015-05-31 MED ORDER — NALBUPHINE HCL 10 MG/ML IJ SOLN: 5.0000 mg | Freq: Once | INTRAMUSCULAR | Status: DC | PRN

## 2015-05-31 MED ORDER — ACETAMINOPHEN 500 MG PO TABS
1000.0000 mg | ORAL_TABLET | Freq: Four times a day (QID) | ORAL | Status: AC
  Administered 2015-06-01 (×2): 1000 mg via ORAL
  Filled 2015-05-31 (×2): qty 2

## 2015-05-31 MED ORDER — LACTATED RINGERS IV SOLN: INTRAVENOUS | Status: DC

## 2015-05-31 MED ORDER — CEFAZOLIN SODIUM-DEXTROSE 2-3 GM-% IV SOLR
2.0000 g | INTRAVENOUS | Status: AC
  Administered 2015-05-31: 2 g via INTRAVENOUS

## 2015-05-31 MED ORDER — ONDANSETRON HCL 4 MG/2ML IJ SOLN
4.0000 mg | Freq: Three times a day (TID) | INTRAMUSCULAR | Status: DC | PRN
Start: 2015-05-31 — End: 2015-06-02
  Administered 2015-05-31: 4 mg via INTRAVENOUS
  Filled 2015-05-31: qty 2

## 2015-05-31 MED ORDER — MEASLES, MUMPS & RUBELLA VAC ~~LOC~~ INJ
0.5000 mL | INJECTION | Freq: Once | SUBCUTANEOUS | Status: DC
Start: 2015-06-01 — End: 2015-06-02
  Filled 2015-05-31: qty 0.5

## 2015-05-31 MED ORDER — ONDANSETRON HCL 4 MG/2ML IJ SOLN
INTRAMUSCULAR | Status: AC
  Filled 2015-05-31: qty 2

## 2015-05-31 MED ORDER — HYDROMORPHONE HCL 1 MG/ML IJ SOLN: 0.2500 mg | INTRAMUSCULAR | Status: DC | PRN

## 2015-05-31 MED ORDER — WITCH HAZEL-GLYCERIN EX PADS: 1.0000 "application " | MEDICATED_PAD | CUTANEOUS | Status: DC | PRN

## 2015-05-31 MED ORDER — DIPHENHYDRAMINE HCL 25 MG PO CAPS
25.0000 mg | ORAL_CAPSULE | ORAL | Status: DC | PRN
Start: 2015-05-31 — End: 2015-06-02

## 2015-05-31 MED ORDER — NALOXONE HCL 0.4 MG/ML IJ SOLN: 0.4000 mg | INTRAMUSCULAR | Status: DC | PRN

## 2015-05-31 MED ORDER — IRON 28 MG PO TABS: 1.0000 | ORAL_TABLET | Freq: Every day | ORAL | Status: DC

## 2015-05-31 MED ORDER — OXYTOCIN 40 UNITS IN LACTATED RINGERS INFUSION - SIMPLE MED: 62.5000 mL/h | INTRAVENOUS | Status: DC

## 2015-05-31 MED ORDER — OXYTOCIN 10 UNIT/ML IJ SOLN
INTRAMUSCULAR | Status: AC
  Filled 2015-05-31: qty 4

## 2015-05-31 MED ORDER — KETOROLAC TROMETHAMINE 30 MG/ML IJ SOLN: 30.0000 mg | Freq: Once | INTRAMUSCULAR | Status: DC | PRN

## 2015-05-31 MED ORDER — DIPHENHYDRAMINE HCL 50 MG/ML IJ SOLN: 12.5000 mg | INTRAMUSCULAR | Status: DC | PRN

## 2015-05-31 MED ORDER — OXYTOCIN 10 UNIT/ML IJ SOLN
40.0000 [IU] | INTRAVENOUS | Status: DC | PRN
  Administered 2015-05-31: 40 [IU] via INTRAVENOUS

## 2015-05-31 MED ORDER — CEFAZOLIN SODIUM-DEXTROSE 2-3 GM-% IV SOLR: 2.0000 g | INTRAVENOUS | Status: DC

## 2015-05-31 MED ORDER — PROMETHAZINE HCL 25 MG/ML IJ SOLN: 6.2500 mg | INTRAMUSCULAR | Status: DC | PRN

## 2015-05-31 MED ORDER — TETANUS-DIPHTH-ACELL PERTUSSIS 5-2.5-18.5 LF-MCG/0.5 IM SUSP: 0.5000 mL | Freq: Once | INTRAMUSCULAR | Status: DC

## 2015-05-31 MED ORDER — CEFAZOLIN SODIUM-DEXTROSE 2-3 GM-% IV SOLR
2.0000 g | Freq: Three times a day (TID) | INTRAVENOUS | Status: DC
  Filled 2015-05-31 (×2): qty 50

## 2015-05-31 MED ORDER — MENTHOL 3 MG MT LOZG
1.0000 | LOZENGE | OROMUCOSAL | Status: DC | PRN
Start: 2015-05-31 — End: 2015-05-31

## 2015-05-31 SURGICAL SUPPLY — 38 items
APL SKNCLS STERI-STRIP NONHPOA (GAUZE/BANDAGES/DRESSINGS) ×1
BENZOIN TINCTURE PRP APPL 2/3 (GAUZE/BANDAGES/DRESSINGS) ×2 IMPLANT
CLAMP CORD UMBIL (MISCELLANEOUS) IMPLANT
CLOSURE WOUND 1/2 X4 (GAUZE/BANDAGES/DRESSINGS) ×1
CLOTH BEACON ORANGE TIMEOUT ST (SAFETY) ×3 IMPLANT
CONTAINER PREFILL 10% NBF 15ML (MISCELLANEOUS) IMPLANT
DRAPE SHEET LG 3/4 BI-LAMINATE (DRAPES) IMPLANT
DRSG OPSITE POSTOP 4X10 (GAUZE/BANDAGES/DRESSINGS) ×3 IMPLANT
DRSG VASELINE 3X18 (GAUZE/BANDAGES/DRESSINGS) ×3 IMPLANT
DURAPREP 26ML APPLICATOR (WOUND CARE) ×3 IMPLANT
ELECT REM PT RETURN 9FT ADLT (ELECTROSURGICAL) ×3
ELECTRODE REM PT RTRN 9FT ADLT (ELECTROSURGICAL) ×1 IMPLANT
EXTRACTOR VACUUM KIWI (MISCELLANEOUS) IMPLANT
EXTRACTOR VACUUM M CUP 4 TUBE (SUCTIONS) IMPLANT
EXTRACTOR VACUUM M CUP 4' TUBE (SUCTIONS)
GLOVE BIO SURGEON STRL SZ7.5 (GLOVE) ×3 IMPLANT
GOWN STRL REUS W/TWL LRG LVL3 (GOWN DISPOSABLE) ×6 IMPLANT
KIT ABG SYR 3ML LUER SLIP (SYRINGE) IMPLANT
NDL HYPO 25X5/8 SAFETYGLIDE (NEEDLE) IMPLANT
NEEDLE HYPO 25X5/8 SAFETYGLIDE (NEEDLE) IMPLANT
NS IRRIG 1000ML POUR BTL (IV SOLUTION) ×3 IMPLANT
PACK C SECTION WH (CUSTOM PROCEDURE TRAY) ×3 IMPLANT
PAD OB MATERNITY 4.3X12.25 (PERSONAL CARE ITEMS) ×3 IMPLANT
PENCIL SMOKE EVAC W/HOLSTER (ELECTROSURGICAL) ×3 IMPLANT
RTRCTR C-SECT PINK 25CM LRG (MISCELLANEOUS) IMPLANT
STRIP CLOSURE SKIN 1/2X4 (GAUZE/BANDAGES/DRESSINGS) ×1 IMPLANT
SUT PLAIN 0 NONE (SUTURE) IMPLANT
SUT VIC AB 0 CT1 36 (SUTURE) ×24 IMPLANT
SUT VIC AB 2-0 SH 27 (SUTURE) ×6
SUT VIC AB 2-0 SH 27XBRD (SUTURE) IMPLANT
SUT VIC AB 3-0 CTX 36 (SUTURE) ×3 IMPLANT
SUT VIC AB 3-0 FS2 27 (SUTURE) ×3 IMPLANT
SUT VIC AB 3-0 SH 27 (SUTURE)
SUT VIC AB 3-0 SH 27X BRD (SUTURE) IMPLANT
SUT VIC AB 4-0 KS 27 (SUTURE) ×2 IMPLANT
SUT VICRYL 0 TIES 12 18 (SUTURE) IMPLANT
TOWEL OR 17X24 6PK STRL BLUE (TOWEL DISPOSABLE) ×3 IMPLANT
TRAY FOLEY CATH SILVER 14FR (SET/KITS/TRAYS/PACK) ×3 IMPLANT

## 2015-05-31 NOTE — Transfer of Care (Signed)
Immediate Anesthesia Transfer of Care Note  Patient: [name]  Procedure(s) Performed: Procedure(s): CESAREAN SECTION WITH BILATERAL TUBAL LIGATION (Bilateral)  Patient Location: PACU  Anesthesia Type:Spinal  Level of Consciousness: awake, alert  and oriented  Airway & Oxygen Therapy: Patient Spontanous Breathing  Post-op Assessment: Report given to RN and Post -op Vital signs reviewed and stable  Post vital signs: Reviewed and stable  Last Vitals:  Filed Vitals:   05/31/15 1119  BP: 116/78  Pulse: 74  Temp: 36.6 C  Resp:     Complications: No apparent anesthesia complications

## 2015-05-31 NOTE — Progress Notes (Signed)
Patient ID: [name], female   DOB: 27-Nov-1980, 34 y.o.   MRN: [id number] Some mild postop nausea, otherwise doing fine VSS  Alert and feeding baby

## 2015-05-31 NOTE — Progress Notes (Signed)
Patient ID: [name], female   DOB: 1980-12-08, 34 y.o.   MRN: [id number] Pt is here for repeat c section and BTL. I examined her 05-30-15 and she reports no change in her health since that time. I have counseled her about permanence of BTL and possible failure rate.

## 2015-05-31 NOTE — Anesthesia Procedure Notes (Addendum)
Spinal Patient location during procedure: OR Start time: 05/31/2015 12:33 PM End time: 05/31/2015 12:36 PM Staffing Anesthesiologist: Lyn Hollingshead Performed by: anesthesiologist  Preanesthetic Checklist Completed: patient identified, surgical consent, pre-op evaluation, timeout performed, IV checked, risks and benefits discussed and monitors and equipment checked Spinal Block Patient position: sitting Prep: site prepped and draped and DuraPrep Patient monitoring: heart rate, cardiac monitor, continuous pulse ox and blood pressure Approach: midline Location: L3-4 Injection technique: single-shot Needle Needle type: Pencan  Needle gauge: 24 G Needle length: 9 cm Needle insertion depth: 4 cm Assessment Sensory level: T4

## 2015-05-31 NOTE — Op Note (Signed)
NAMEFAY, BAGG            ACCOUNT NO.:  [id number]  MEDICAL RECORD NO.:  [id number]  LOCATION:  [facility]                          FACILITY:  [facility]  PHYSICIAN:  Lucille Passy. [name], M.D. DATE OF BIRTH:  05-Jun-1981  DATE OF PROCEDURE:  05/31/2015 DATE OF DISCHARGE:                              OPERATIVE REPORT   PREOPERATIVE DIAGNOSES:  Intrauterine pregnancy at 39+ weeks, two prior C-sections, voluntary sterilization.  POSTOPERATIVE DIAGNOSES:  Intrauterine pregnancy at 39+ weeks, two prior C-sections, voluntary sterilization.  OPERATION PERFORMED:  Low-transverse cervical C-section.  ASSISTANT:  Clarene Duke, M.D.  ANESTHESIA:  Spinal anesthesia by Dr. Jillyn Hidden.  DESCRIPTION OF PROCEDURE:  The patient was brought to the operating the room and given a spinal anesthetic by Dr. Jillyn Hidden.  She was placed supine, tilted to do the left.  The vulva and vagina were prepped with Betadine solution.  The urethra was prepped.  The Foley catheter was inserted to straight drain.  The patient was then placed at a frogleg into the supine position.  The abdomen was prepped with DuraPrep during her 3-minute wait.  A time-out was done identifying the patient, the proposed surgery of C-section and tubal ligation.  The area was draped as a sterile field.  Pinching the lower abdomen with an Allis clamp caused no discomfort.  The old incision was used to carry the skin layer, skin incision through the skin, subcutaneous tissue and down to the fascia.  The fascia was incised transversely separated from the rectus muscle superiorly and inferiorly.  Rectus muscle was opened in the midline.  Enough room that I could easily deliver the baby was made. An Alexis retractor was placed into the abdominal cavity and tightened down to expose the lower uterine segment.  A short transverse incision was made in the lower uterine segment through the superficial layers of the myometrium.  I then went the rest  away into the amniotic sac with my finger spilling clear fluid.  The infant's vertex was grasped and easily delivered through the incisional opening.  Both shoulders were delivered without difficulty after suctioning the baby's nose and mouth with a bulb syringe.  The cord was clamped after a 1-minute delay and the infant was given to the waiting neonatologist who was in attendance and it was a female infant, Dr. Tamala Julian gave Apgars of 8 at 1 and 9 at 5 minutes.  Routine cord blood studies were obtained.  The placenta was removed.  The inside of the uterus was inspected and found to be free of any debris.  I removed all remaining fragments of membranes and then the uterine incision was closed with 2 running sutures of 0 Vicryl locking the first layer, nonlocking on the second layer.  Liberal irrigation confirmed hemostasis except one part in the middle of the incision, it was reinforced with a figure-of-eight suture of 0 Vicryl.  Both tubes and ovaries appeared normal as did the uterus.  The tubes were easily manipulated into the operative field.  I clamped across both distal segments of both tubes.  I used 1 suture to tie the pedicle on the left and 2 on the right.  There was no bleeding.  The uterus  was inspected and found to be hemostatic.  The Alexis retractor was removed.  The abdominal wall was closed in layers using interrupted sutures of 0 Vicryl to close the rectus muscle and peritoneum in 1 layer, two running sutures of 0 Vicryl on the fascia, a running 3-0 Vicryl on the subcutaneous tissue, and a suture of 4-0 Vicryl on a Keith needle was used to do a subcuticular skin closure.  I then cleansed the incision, dried it, used a sticky material above and below the incision to help reinforce the 1.5 inch x 4 inch Steri-Strips and the procedure was terminated.  Blood loss was estimated by the CRNA as 600 mL.  Sponge and needle counts were correct, and she was returned to recovery in  satisfactory condition.     Lucille Passy. [name], M.D.     TFH/MEDQ  D:  05/31/2015  T:  05/31/2015  Job:  [id number]

## 2015-05-31 NOTE — Anesthesia Preprocedure Evaluation (Signed)
Anesthesia Evaluation  Patient identified by MRN, date of birth, ID band Patient awake    Reviewed: Allergy & Precautions, H&P , NPO status , Patient's Chart, lab work & pertinent test results  Airway Mallampati: I  TM Distance: >3 FB Neck ROM: full    Dental no notable dental hx.    Pulmonary    Pulmonary exam normal        Cardiovascular negative cardio ROS Normal cardiovascular exam     Neuro/Psych negative psych ROS   GI/Hepatic negative GI ROS, Neg liver ROS,   Endo/Other  diabetes  Renal/GU negative Renal ROS     Musculoskeletal   Abdominal Normal abdominal exam  (+)   Peds  Hematology negative hematology ROS (+)   Anesthesia Other Findings   Reproductive/Obstetrics (+) Pregnancy                             Anesthesia Physical Anesthesia Plan  ASA: II  Anesthesia Plan: Spinal   Post-op Pain Management:    Induction:   Airway Management Planned:   Additional Equipment:   Intra-op Plan:   Post-operative Plan:   Informed Consent: I have reviewed the patients History and Physical, chart, labs and discussed the procedure including the risks, benefits and alternatives for the proposed anesthesia with the patient or authorized representative who has indicated his/her understanding and acceptance.     Plan Discussed with: CRNA and Surgeon  Anesthesia Plan Comments:         Anesthesia Quick Evaluation

## 2015-05-31 NOTE — Anesthesia Postprocedure Evaluation (Signed)
Anesthesia Post Note  Patient: [name]  Procedure(s) Performed: Procedure(s) (LRB): CESAREAN SECTION WITH BILATERAL TUBAL LIGATION (Bilateral)  Anesthesia type: Spinal  Patient location: PACU  Post pain: Pain level controlled  Post assessment: Post-op Vital signs reviewed  Last Vitals:  Filed Vitals:   05/31/15 1445  BP: 115/84  Pulse: 54  Temp:   Resp: 17    Post vital signs: Reviewed  Level of consciousness: awake  Complications: No apparent anesthesia complications

## 2015-06-01 ENCOUNTER — Encounter (HOSPITAL_COMMUNITY): Payer: Self-pay | Admitting: Obstetrics and Gynecology

## 2015-06-01 LAB — CBC
HCT: 28.7 % — ABNORMAL LOW (ref 36.0–46.0)
HEMOGLOBIN: 9.6 g/dL — AB (ref 12.0–15.0)
MCH: 29.9 pg (ref 26.0–34.0)
MCHC: 33.4 g/dL (ref 30.0–36.0)
MCV: 89.4 fL (ref 78.0–100.0)
PLATELETS: 134 10*3/uL — AB (ref 150–400)
RBC: 3.21 MIL/uL — AB (ref 3.87–5.11)
RDW: 15.4 % (ref 11.5–15.5)
WBC: 3.1 10*3/uL — AB (ref 4.0–10.5)

## 2015-06-01 NOTE — Addendum Note (Signed)
Addendum  created 06/01/15 0844 by Jonna Munro, CRNA   Modules edited: Notes Section   Notes Section:  File: [id number]

## 2015-06-01 NOTE — Anesthesia Postprocedure Evaluation (Signed)
  Anesthesia Post-op Note  Patient: [name]  Procedure(s) Performed: Procedure(s): CESAREAN SECTION WITH BILATERAL TUBAL LIGATION (Bilateral)  Patient Location: Mother/Baby  Anesthesia Type:Spinal  Level of Consciousness: awake, alert  and oriented  Airway and Oxygen Therapy: Patient Spontanous Breathing  Post-op Pain: none  Post-op Assessment: Post-op Vital signs reviewed, Patient's Cardiovascular Status Stable, Respiratory Function Stable, Patent Airway, No signs of Nausea or vomiting, Adequate PO intake, Pain level controlled, No headache and No backache    Post-op Vital Signs: Reviewed and stable  Last Vitals:  Filed Vitals:   06/01/15 0842  BP: 112/66  Pulse: 54  Temp: 36.6 C  Resp: 18    Complications: No apparent anesthesia complications

## 2015-06-01 NOTE — Lactation Note (Signed)
This note was copied from the chart of Amber Tifini Reeder. Lactation Consultation Note  P4,  Mother breastfed other children for 6 months -1 month. Baby 45 hours old.  1 void, 1 stool,  Breastfedx6.  Encouraged mother to undress baby for feedings since she has been sleepy. Mother complains of nipple soreness. Hand expression taught to Mom and apply ebm to nipples for soreness. Provided mother w/ comfort gels.  Instructed mother how to achieve a deep latch and do chin tug. Assisted mother in latching in cross cradle. Baby has small tight latch. A few swallows observed.  Continue working on depth and waiting for wide open mouth. Mom encouraged to feed baby 8-12 times/24 hours and with feeding cues.  Mom made aware of O/P services, breastfeeding support groups, community resources, and our phone # for post-discharge questions.     Patient Name: [name] VOJJK'K Date: 06/01/2015 Reason for consult: Initial assessment   Maternal Data Has patient been taught Hand Expression?: Yes Does the patient have breastfeeding experience prior to this delivery?: Yes  Feeding Feeding Type: Breast Fed  LATCH Score/Interventions Latch: Repeated attempts needed to sustain latch, nipple held in mouth throughout feeding, stimulation needed to elicit sucking reflex.  Audible Swallowing: A few with stimulation  Type of Nipple: Everted at rest and after stimulation  Comfort (Breast/Nipple): Soft / non-tender     Hold (Positioning): Assistance needed to correctly position infant at breast and maintain latch.  LATCH Score: 7  Lactation Tools Discussed/Used     Consult Status Consult Status: Follow-up Date: 06/02/15 Follow-up type: In-patient    Vivianne Master [facility] 06/01/2015, 4:40 PM

## 2015-06-01 NOTE — Progress Notes (Signed)
Patient ID: [name], female   DOB: 02-12-1981, 34 y.o.   MRN: [id number] #1 afebrile BP normal Output good HGB 11.1 to 9.6 Tolerating a diet but no flatus.

## 2015-06-02 MED ORDER — IBUPROFEN 600 MG PO TABS: 600.0000 mg | ORAL_TABLET | Freq: Four times a day (QID) | ORAL | Status: DC | PRN

## 2015-06-02 MED ORDER — OXYCODONE-ACETAMINOPHEN 5-325 MG PO TABS: 1.0000 | ORAL_TABLET | Freq: Four times a day (QID) | ORAL | Status: DC | PRN

## 2015-06-02 NOTE — Discharge Instructions (Signed)
booklet °

## 2015-06-02 NOTE — Progress Notes (Signed)
Patient ID: [name], female   DOB: 06/26/1981, 34 y.o.   MRN: [id number] #[id number] afebrile BP normal She has had no problems. She is ambulating, tolerating a regular diet and has had a BM She is ready for d/c

## 2015-06-02 NOTE — Discharge Summary (Signed)
[name], HOOBLER            ACCOUNT NO.:  [id number]  MEDICAL RECORD NO.:  [id number]  LOCATION:  9127                          FACILITY:  [facility]  PHYSICIAN:  Lucille Passy. [name], M.D. DATE OF BIRTH:  1980-11-19  DATE OF ADMISSION:  05/31/2015 DATE OF DISCHARGE:  06/02/2015                              DISCHARGE SUMMARY   HISTORY AND HOSPITAL COURSE:  This is a 34 year old black female, para 2- 1-1-3, gravida 5, EDC June 06, 2015, admitted for repeat C-section after having had 2 prior C-sections.  She underwent a low-transverse cervical C-section and bilateral partial salpingectomy by Dr. [name], with Dr. Perlie Gold assisting, under spinal anesthesia by Dr. Jillyn Hidden. The surgery was uneventful.  The path report showed 2 portions of fallopian tubes.  The distal segment of each tube was excised.  The patient's postoperative course was uneventful.  She tolerated the surgery well.  She was able to ambulate, tolerated a regular diet.  Had a bowel movement on the first postop day, and she is ready for discharge.  Her initial hemoglobin was 11.1, hematocrit March 33.1, white count 3900.  Potassium was slightly low at 3.3.  Platelet count was 154,000. Postop hemoglobin was 9.6.  FINAL DIAGNOSES:  Intrauterine pregnancy at 39+ weeks, two prior C- sections.  Delivered by repeat C-section.  Voluntary sterilization.  OPERATION:  Low-transverse cervical C-section, bilateral partial distal salpingectomy.  FINAL CONDITION:  Improved.  INSTRUCTIONS:  Include our regular discharge instruction booklet, our after visit summary.  Prescriptions for Percocet 5/325, 30 tablets, 1 every 6 hours as needed for pain, Motrin 600 mg 30 tablets, 1 every 6 hours as needed for pain.  She is to continue her prenatal vitamins and continue ferrous sulfate.  She is advised to return to the office in 2 weeks for followup examination.     Lucille Passy. [name], M.D.     TFH/MEDQ  D:  06/02/2015  T:   06/02/2015  Job:  [id number]

## 2015-06-05 ENCOUNTER — Encounter (HOSPITAL_COMMUNITY): Payer: Self-pay | Admitting: *Deleted

## 2015-06-05 ENCOUNTER — Emergency Department (HOSPITAL_COMMUNITY)
Admission: EM | Admit: 2015-06-05 | Discharge: 2015-06-06 | Disposition: A | Discharge status: Home / Self Care | Payer: BC Managed Care – PPO | Attending: Emergency Medicine | Admitting: Emergency Medicine

## 2015-06-05 ENCOUNTER — Emergency Department (HOSPITAL_COMMUNITY): Payer: BC Managed Care – PPO

## 2015-06-05 DIAGNOSIS — R51 Headache: Secondary | ICD-10-CM | POA: Insufficient documentation

## 2015-06-05 DIAGNOSIS — Z8632 Personal history of gestational diabetes: Secondary | ICD-10-CM | POA: Diagnosis not present

## 2015-06-05 DIAGNOSIS — M545 Low back pain: Secondary | ICD-10-CM | POA: Diagnosis not present

## 2015-06-05 DIAGNOSIS — O9089 Other complications of the puerperium, not elsewhere classified: Secondary | ICD-10-CM | POA: Insufficient documentation

## 2015-06-05 DIAGNOSIS — O9953 Diseases of the respiratory system complicating the puerperium: Secondary | ICD-10-CM | POA: Insufficient documentation

## 2015-06-05 DIAGNOSIS — O99355 Diseases of the nervous system complicating the puerperium: Secondary | ICD-10-CM | POA: Insufficient documentation

## 2015-06-05 DIAGNOSIS — J45909 Unspecified asthma, uncomplicated: Secondary | ICD-10-CM | POA: Diagnosis not present

## 2015-06-05 DIAGNOSIS — R519 Headache, unspecified: Secondary | ICD-10-CM

## 2015-06-05 DIAGNOSIS — G8918 Other acute postprocedural pain: Secondary | ICD-10-CM | POA: Diagnosis not present

## 2015-06-05 DIAGNOSIS — Z8619 Personal history of other infectious and parasitic diseases: Secondary | ICD-10-CM | POA: Insufficient documentation

## 2015-06-05 DIAGNOSIS — H538 Other visual disturbances: Secondary | ICD-10-CM | POA: Insufficient documentation

## 2015-06-05 LAB — COMPREHENSIVE METABOLIC PANEL
ALT: 26 U/L (ref 14–54)
ANION GAP: 10 (ref 5–15)
AST: 27 U/L (ref 15–41)
Albumin: 2.9 g/dL — ABNORMAL LOW (ref 3.5–5.0)
Alkaline Phosphatase: 190 U/L — ABNORMAL HIGH (ref 38–126)
BILIRUBIN TOTAL: 0.4 mg/dL (ref 0.3–1.2)
BUN: 10 mg/dL (ref 6–20)
CHLORIDE: 102 mmol/L (ref 101–111)
CO2: 25 mmol/L (ref 22–32)
Calcium: 9.1 mg/dL (ref 8.9–10.3)
Creatinine, Ser: 0.77 mg/dL (ref 0.44–1.00)
Glucose, Bld: 107 mg/dL — ABNORMAL HIGH (ref 65–99)
POTASSIUM: 3.2 mmol/L — AB (ref 3.5–5.1)
Sodium: 137 mmol/L (ref 135–145)
TOTAL PROTEIN: 7 g/dL (ref 6.5–8.1)

## 2015-06-05 LAB — CBC WITH DIFFERENTIAL/PLATELET
Basophils Absolute: 0 10*3/uL (ref 0.0–0.1)
Basophils Relative: 1 %
EOS PCT: 1 %
Eosinophils Absolute: 0.1 10*3/uL (ref 0.0–0.7)
HEMATOCRIT: 38 % (ref 36.0–46.0)
Hemoglobin: 12.6 g/dL (ref 12.0–15.0)
LYMPHS PCT: 42 %
Lymphs Abs: 1.7 10*3/uL (ref 0.7–4.0)
MCH: 29.6 pg (ref 26.0–34.0)
MCHC: 33.2 g/dL (ref 30.0–36.0)
MCV: 89.4 fL (ref 78.0–100.0)
MONO ABS: 0.3 10*3/uL (ref 0.1–1.0)
MONOS PCT: 7 %
NEUTROS ABS: 2 10*3/uL (ref 1.7–7.7)
Neutrophils Relative %: 49 %
PLATELETS: 245 10*3/uL (ref 150–400)
RBC: 4.25 MIL/uL (ref 3.87–5.11)
RDW: 14.8 % (ref 11.5–15.5)
WBC: 4.1 10*3/uL (ref 4.0–10.5)

## 2015-06-05 LAB — URIC ACID: URIC ACID, SERUM: 6 mg/dL (ref 2.3–6.6)

## 2015-06-05 LAB — URINE MICROSCOPIC-ADD ON

## 2015-06-05 LAB — PROTEIN / CREATININE RATIO, URINE
CREATININE, URINE: 129.18 mg/dL
PROTEIN CREATININE RATIO: 0.61 mg/mg{creat} — AB (ref 0.00–0.15)
TOTAL PROTEIN, URINE: 79 mg/dL

## 2015-06-05 LAB — URINALYSIS, ROUTINE W REFLEX MICROSCOPIC
BILIRUBIN URINE: NEGATIVE
GLUCOSE, UA: NEGATIVE mg/dL
KETONES UR: NEGATIVE mg/dL
LEUKOCYTES UA: NEGATIVE
Nitrite: NEGATIVE
PH: 5.5 (ref 5.0–8.0)
Protein, ur: 30 mg/dL — AB
Specific Gravity, Urine: 1.023 (ref 1.005–1.030)
Urobilinogen, UA: 1 mg/dL (ref 0.0–1.0)

## 2015-06-05 MED ORDER — SODIUM CHLORIDE 0.9 % IV BOLUS (SEPSIS)
1000.0000 mL | Freq: Once | INTRAVENOUS | Status: AC
  Administered 2015-06-05: 1000 mL via INTRAVENOUS

## 2015-06-05 NOTE — ED Notes (Signed)
Pt reports having csection on wed, discharged on Friday and then onset of headache. Has blurred vision, nausea, stiff neck. Denies fever.

## 2015-06-05 NOTE — ED Provider Notes (Signed)
CSN: [id number]     Arrival date & time 06/05/15  1703 History   First MD Initiated Contact with Patient 06/05/15 2041     Chief Complaint  Patient presents with  . Headache  . Post-op Problem    The history is provided by the patient and medical records.     34 year old female with history of C-section on 10/5 presenting with headache. Onset- Friday. Context- on Wednesday had epidural and C section of healthy newborn and was discharged from hospital Friday. States didn't have headache until then. Located in occipital area and behind both eyes. Severe. Worsening steadily since onset on Friday. Described as throbbing and pressure. Worse with ambulating and somewhat partly improved by rest/lying flat. When pain is most severe behind eyes occasionally has blurring of vision though pt does not have vision changes currently. Associated sx include neck stiffness. Denies focal weakness, focal numbness, current vision changes, difficulty ambulating, fevers or chills.   Past Medical History  Diagnosis Date  . PONV (postoperative nausea and vomiting)   . Spinal headache   . Ganglion cyst of wrist 08/2011    left  . Asthma     as a child - no current med. or prob.  . Seasonal allergies   . Gestational diabetes   . Allergy   . HSV infection    Past Surgical History  Procedure Laterality Date  . Cesarean section  05/14/2005; 11/19/2007  . Ganglion cyst excision  10/01/2011    Procedure: REMOVAL GANGLION OF WRIST;  Surgeon: Cammie Sickle., MD;  Location: [facility];  Service: Orthopedics;  Laterality: Left;  left wrist  . Cesarean section with bilateral tubal ligation Bilateral 05/31/2015    Procedure: CESAREAN SECTION WITH BILATERAL TUBAL LIGATION;  Surgeon: Newton Pigg, MD;  Location: [facility];  Service: Obstetrics;  Laterality: Bilateral;   History reviewed. No pertinent family history. Social History  Substance Use Topics  . Smoking status: Never Smoker   . Smokeless  tobacco: Never Used  . Alcohol Use: Yes     Comment: occasionally   OB History    Gravida Para Term Preterm AB TAB SAB Ectopic Multiple Living   5 1 1  1 1    0 1     Review of Systems  Constitutional: Negative for fever.  HENT: Negative for rhinorrhea.   Eyes: Positive for visual disturbance.  Respiratory: Negative for shortness of breath.   Cardiovascular: Negative for chest pain.  Gastrointestinal: Negative for vomiting and abdominal pain.  Genitourinary: Negative for decreased urine volume.  Musculoskeletal: Positive for back pain and neck stiffness.  Allergic/Immunologic: Negative for immunocompromised state.  Neurological: Positive for headaches. Negative for weakness and numbness.  Psychiatric/Behavioral: Negative for confusion.    Allergies  Review of patient's allergies indicates no known allergies.  Home Medications   Prior to Admission medications   Medication Sig Start Date End Date Taking? Authorizing Provider  ibuprofen (ADVIL,MOTRIN) 600 MG tablet Take 1 tablet (600 mg total) by mouth every 6 (six) hours as needed for mild pain. 06/02/15  Yes Newton Pigg, MD  oxyCODONE-acetaminophen (PERCOCET/ROXICET) 5-325 MG tablet Take 1 tablet by mouth every 6 (six) hours as needed (for pain scale 4-7). 06/02/15  Yes Newton Pigg, MD   BP 122/75 mmHg  Pulse 62  Temp(Src) 98.1 F (36.7 C) (Oral)  Resp 18  SpO2 100%  LMP 09/02/2014 Physical Exam  Constitutional: She is oriented to person, place, and time. She appears well-developed and well-nourished. No distress.  HENT:  Head: Normocephalic and atraumatic.  Eyes: Right eye exhibits no discharge. Left eye exhibits no discharge.  Neck: No tracheal deviation present.  Cardiovascular: Normal rate and regular rhythm.   Pulmonary/Chest: Effort normal and breath sounds normal. No respiratory distress.  Abdominal: Soft. She exhibits no distension. There is no tenderness.  Musculoskeletal: She exhibits tenderness (lumbar  spine, no erythema or focal swelling ). She exhibits no edema.  Neurological: She is alert and oriented to person, place, and time. She has normal strength and normal reflexes. No cranial nerve deficit or sensory deficit. Coordination and gait normal. GCS eye subscore is 4. GCS verbal subscore is 5. GCS motor subscore is 6.  Skin: Skin is warm and dry.  Psychiatric: She has a normal mood and affect. Her behavior is normal.  Nursing note and vitals reviewed.   ED Course  Procedures (including critical care time) Labs Review Labs Reviewed  COMPREHENSIVE METABOLIC PANEL - Abnormal; Notable for the following:    Potassium 3.2 (*)    Glucose, Bld 107 (*)    Albumin 2.9 (*)    Alkaline Phosphatase 190 (*)    All other components within normal limits  PROTEIN / CREATININE RATIO, URINE - Abnormal; Notable for the following:    Protein Creatinine Ratio 0.61 (*)    All other components within normal limits  URINALYSIS, ROUTINE W REFLEX MICROSCOPIC (NOT AT [facility]) - Abnormal; Notable for the following:    APPearance CLOUDY (*)    Hgb urine dipstick LARGE (*)    Protein, ur 30 (*)    All other components within normal limits  CBC WITH DIFFERENTIAL/PLATELET  URIC ACID  URINE MICROSCOPIC-ADD ON    Imaging Review Mr Brain Wo Contrast  06/06/2015   CLINICAL DATA:  Initial evaluation for acute headache. Since C-section.  EXAM: MRI HEAD WITHOUT CONTRAST  TECHNIQUE: Multiplanar, multiecho pulse sequences of the brain and surrounding structures were obtained without intravenous contrast.  COMPARISON:  None.  FINDINGS: The CSF containing spaces are within normal limits for patient age. No focal parenchymal signal abnormality is identified. No mass lesion, midline shift, or extra-axial fluid collection. Ventricles are normal in size without evidence of hydrocephalus.  No diffusion-weighted signal abnormality is identified to suggest acute intracranial infarct. Gray-white matter differentiation is  maintained. Normal flow voids are seen within the intracranial vasculature. No intracranial hemorrhage identified.  The cervicomedullary junction is normal. Pituitary gland mildly prominent, likely within normal limits for patient age and recent pregnancy. Pituitary stalk is midline. The globes and optic nerves demonstrate a normal appearance with normal signal intensity.  The bone marrow signal intensity is normal. Calvarium is intact. Visualized upper cervical spine is within normal limits.  Scalp soft tissues are unremarkable.  Paranasal sinuses are clear.  No mastoid effusion.  Dedicated MRV images demonstrate no evidence for venous sinus thrombosis. The superior sagittal, transverse, and sigmoid sinuses are patent. Visualized internal jugular veins are patent. Deep cerebral veins including the straight sinus and vein of Galen are patent. Internal cerebral veins are patent. No evidence for cortical vein thrombosis.  IMPRESSION: Normal MRI/ MRV of the brain.   Electronically Signed   By: Jeannine Boga M.D.   On: 06/06/2015 00:23   Mr Hilary Hertz  06/06/2015   CLINICAL DATA:  Initial evaluation for acute headache. Since C-section.  EXAM: MRI HEAD WITHOUT CONTRAST  TECHNIQUE: Multiplanar, multiecho pulse sequences of the brain and surrounding structures were obtained without intravenous contrast.  COMPARISON:  None.  FINDINGS: The  CSF containing spaces are within normal limits for patient age. No focal parenchymal signal abnormality is identified. No mass lesion, midline shift, or extra-axial fluid collection. Ventricles are normal in size without evidence of hydrocephalus.  No diffusion-weighted signal abnormality is identified to suggest acute intracranial infarct. Gray-white matter differentiation is maintained. Normal flow voids are seen within the intracranial vasculature. No intracranial hemorrhage identified.  The cervicomedullary junction is normal. Pituitary gland mildly prominent, likely  within normal limits for patient age and recent pregnancy. Pituitary stalk is midline. The globes and optic nerves demonstrate a normal appearance with normal signal intensity.  The bone marrow signal intensity is normal. Calvarium is intact. Visualized upper cervical spine is within normal limits.  Scalp soft tissues are unremarkable.  Paranasal sinuses are clear.  No mastoid effusion.  Dedicated MRV images demonstrate no evidence for venous sinus thrombosis. The superior sagittal, transverse, and sigmoid sinuses are patent. Visualized internal jugular veins are patent. Deep cerebral veins including the straight sinus and vein of Galen are patent. Internal cerebral veins are patent. No evidence for cortical vein thrombosis.  IMPRESSION: Normal MRI/ MRV of the brain.   Electronically Signed   By: Jeannine Boga M.D.   On: 06/06/2015 00:23   I have personally reviewed and evaluated these images and lab results as part of my medical decision-making.   EKG Interpretation None      MDM   Final diagnoses:  Headache    34 year old female with history of C-section on 10/5 presenting with headache. Neuro intact. Primary differential includes spinal/post epidural HA from CSF leak, deep sinus thrombosis, primary HA syndrome. Feel less likely meningitis/infectious given no meningismus or fevers, intermittent nature of sx, so will not perform LP at this time. MRI/venography neg. Improved somewhat with IVF. Plan to DC with f/u as outpatient for epidural blood patch. Verbal discharge instructions regarding return precautions and f/u care.   Case discussed with Dr. Tomi Bamberger, who oversaw management of this patient.    Ivin Booty, MD 06/06/15 [phone]

## 2015-06-06 NOTE — ED Provider Notes (Signed)
Seen in conjunction with Dr Amedeo Gory.  Please see her note.  Pt presented with complaints of headache.  Recently postpartum.  Did have an epidural.  Not a clear positional component.  No fevers or meningismus.  Sx concerning for possible spinal headache, vs central venous thrombosis considering her visual complaints.  MRI MRV performed to rule out the latter.  Negative for acute process.  At this time there does not appear to be any evidence of an acute emergency medical condition and the patient appears stable for discharge with appropriate outpatient follow up.   Dorie Rank, MD 06/06/15 [phone]

## 2015-06-06 NOTE — ED Notes (Signed)
Pt. Left with all belongings and refused wheelchair. Discharge instructions were reviewed and all questions were answered.  

## 2015-09-16 ENCOUNTER — Inpatient Hospital Stay (HOSPITAL_COMMUNITY)
Admission: AD | Admit: 2015-09-16 | Discharge: 2015-09-16 | Disposition: A | Discharge status: Home / Self Care | Payer: BC Managed Care – PPO | Source: Ambulatory Visit | Attending: Obstetrics and Gynecology | Admitting: Obstetrics and Gynecology

## 2015-09-16 ENCOUNTER — Encounter (HOSPITAL_COMMUNITY): Payer: Self-pay

## 2015-09-16 DIAGNOSIS — N920 Excessive and frequent menstruation with regular cycle: Secondary | ICD-10-CM | POA: Diagnosis not present

## 2015-09-16 DIAGNOSIS — N939 Abnormal uterine and vaginal bleeding, unspecified: Secondary | ICD-10-CM | POA: Diagnosis present

## 2015-09-16 LAB — CBC
HEMATOCRIT: 33.7 % — AB (ref 36.0–46.0)
HEMOGLOBIN: 11.3 g/dL — AB (ref 12.0–15.0)
MCH: 29.4 pg (ref 26.0–34.0)
MCHC: 33.5 g/dL (ref 30.0–36.0)
MCV: 87.8 fL (ref 78.0–100.0)
Platelets: 228 10*3/uL (ref 150–400)
RBC: 3.84 MIL/uL — ABNORMAL LOW (ref 3.87–5.11)
RDW: 14.6 % (ref 11.5–15.5)
WBC: 4.3 10*3/uL (ref 4.0–10.5)

## 2015-09-16 LAB — URINE MICROSCOPIC-ADD ON

## 2015-09-16 LAB — URINALYSIS, ROUTINE W REFLEX MICROSCOPIC
Bilirubin Urine: NEGATIVE
Glucose, UA: NEGATIVE mg/dL
Ketones, ur: NEGATIVE mg/dL
LEUKOCYTES UA: NEGATIVE
Nitrite: NEGATIVE
PROTEIN: NEGATIVE mg/dL
pH: 5.5 (ref 5.0–8.0)

## 2015-09-16 LAB — POCT PREGNANCY, URINE: Preg Test, Ur: NEGATIVE

## 2015-09-16 MED ORDER — IBUPROFEN 600 MG PO TABS: 600.0000 mg | ORAL_TABLET | Freq: Four times a day (QID) | ORAL | Status: AC | PRN

## 2015-09-16 MED ORDER — IBUPROFEN 800 MG PO TABS
800.0000 mg | ORAL_TABLET | Freq: Once | ORAL | Status: AC
  Administered 2015-09-16: 800 mg via ORAL
  Filled 2015-09-16: qty 1

## 2015-09-16 NOTE — MAU Provider Note (Signed)
History     CSN: [id number]  Arrival date and time: 09/16/15 2117   First Provider Initiated Contact with Patient 09/16/15 2153      Chief Complaint  Patient presents with  . Vaginal Bleeding   HPI  [name] is a 35 y.o. [id number] presenting with vaginal bleeding.  Reports her regular period started yesterday and was like a normal period Today heavy, changing tampon every 15-20 minutes. Using super plus tampons.  Never had bleeding like this before. Reports some clots.  Reports more cramping this cycle Has not taken any medication Had BTS during CS in October Reports history of fibroids Currently breast and formula feeding, no recent reduction in the amount of breastfeeding.   Menses typically 4 days in length, menses every month very regular.     Pertinent Gynecological History: Menses: flow is excessive with use of #10 pads or tampons on heaviest days Bleeding: Regular periods Contraception: tubal ligation DES exposure: denies Blood transfusions: none   Past Medical History  Diagnosis Date  . PONV (postoperative nausea and vomiting)   . Spinal headache   . Ganglion cyst of wrist 08/2011    left  . Asthma     as a child - no current med. or prob.  . Seasonal allergies   . Gestational diabetes   . Allergy   . HSV infection     Past Surgical History  Procedure Laterality Date  . Cesarean section  05/14/2005; 11/19/2007  . Ganglion cyst excision  10/01/2011    Procedure: REMOVAL GANGLION OF WRIST;  Surgeon: Cammie Sickle., MD;  Location: [place];  Service: Orthopedics;  Laterality: Left;  left wrist  . Cesarean section with bilateral tubal ligation Bilateral 05/31/2015    Procedure: CESAREAN SECTION WITH BILATERAL TUBAL LIGATION;  Surgeon: Ying Blankenhorn Pigg, MD;  Location: [facility];  Service: Obstetrics;  Laterality: Bilateral;    History reviewed. No pertinent family history.  Social History  Substance Use Topics  . Smoking status: Never  Smoker   . Smokeless tobacco: Never Used  . Alcohol Use: Yes     Comment: occasionally    Allergies: No Known Allergies  Prescriptions prior to admission  Medication Sig Dispense Refill Last Dose  . ibuprofen (ADVIL,MOTRIN) 600 MG tablet Take 1 tablet (600 mg total) by mouth every 6 (six) hours as needed for mild pain. 30 tablet 0 06/04/2015 at Unknown time  . oxyCODONE-acetaminophen (PERCOCET/ROXICET) 5-325 MG tablet Take 1 tablet by mouth every 6 (six) hours as needed (for pain scale 4-7). 30 tablet 0 not yet started    Review of Systems  Constitutional: Negative for fever and chills.  Eyes: Negative for blurred vision and double vision.  Respiratory: Negative for cough and shortness of breath.   Cardiovascular: Negative for chest pain and orthopnea.  Gastrointestinal: Negative for nausea and vomiting.  Genitourinary: Negative for dysuria, frequency and flank pain.  Musculoskeletal: Negative for myalgias.  Skin: Negative for rash.  Neurological: Negative for dizziness, tingling, weakness and headaches.  Endo/Heme/Allergies: Does not bruise/bleed easily (no family history of bleeding or clotting disorders).  Psychiatric/Behavioral: Negative for depression and suicidal ideas. The patient is not nervous/anxious.    Physical Exam   Blood pressure 121/81, pulse 67, temperature 97.9 F (36.6 C), temperature source Oral, resp. rate 16, unknown if currently breastfeeding.  Physical Exam  Nursing note and vitals reviewed. Constitutional: She is oriented to person, place, and time. She appears well-developed and well-nourished. No distress.  HENT:  Head: Normocephalic and atraumatic.  Eyes: Conjunctivae are normal. No scleral icterus.  Slightly pale conjunctiva  Neck: Normal range of motion. Neck supple.  Cardiovascular: Normal rate and intact distal pulses.   Respiratory: Effort normal. She exhibits no tenderness.  GI: Soft. There is no tenderness. There is no rebound and no  guarding.  Genitourinary: Vagina normal.  Musculoskeletal: Normal range of motion. She exhibits no edema.  Neurological: She is alert and oriented to person, place, and time.  Skin: Skin is warm and dry. No rash noted.  Psychiatric: She has a normal mood and affect.    MAU Course  Procedures  MDM Ibuprofen 800mg  received in MAU  CBC Latest Ref Rng 09/16/2015 06/05/2015 06/01/2015  WBC 4.0 - 10.5 K/uL 4.3 4.1 3.1(L)  Hemoglobin 12.0 - 15.0 g/dL 11.3(L) 12.6 9.6(L)  Hematocrit 36.0 - 46.0 % 33.7(L) 38.0 28.7(L)  Platelets 150 - 400 K/uL 228 245 134(L)   Assessment and Plan  #Menorrhagia, with regular cycle - currently day 2 of period, low risk for progression to anemia - discussed use of NSAID for bleeding and cramping - follow up with OB/GYN if period lasting > 7d, dizziness, lightheadedness, palpitations, syncope - discussed with Dr. Delton See 09/16/2015, 9:58 PM

## 2015-09-16 NOTE — MAU Note (Signed)
Patient presents with vaginal bleeding that started yesterday and cramping that started on the 18th.

## 2015-09-16 NOTE — Discharge Instructions (Signed)
To help with your bleeding start taking ibuprofen (either those prescribed or 4 over the counter tablets). You can take these every 6 hours and this will help with both the bleeding and cramping. Please call your OB office if your bleeding is not improved by Wed/Thurs  Abnormal Uterine Bleeding Abnormal uterine bleeding means bleeding from the vagina that is not your normal menstrual period. This can be:  Bleeding or spotting between periods.  Bleeding after sex (sexual intercourse).  Bleeding that is heavier or more than normal.  Periods that last longer than usual.  Bleeding after menopause. There are many problems that may cause this. Treatment will depend on the cause of the bleeding. Any kind of bleeding that is not normal should be reviewed by your doctor.  HOME CARE Watch your condition for any changes. These actions may lessen any discomfort you are having:  Do not use tampons or douches as told by your doctor.  Change your pads often. You should get regular pelvic exams and Pap tests. Keep all appointments for tests as told by your doctor. GET HELP IF:  You are bleeding for more than 1 week.  You feel dizzy at times. GET HELP RIGHT AWAY IF:   You pass out.  You have to change pads every 15 to 30 minutes.  You have belly pain.  You have a fever.  You become sweaty or weak.  You are passing large blood clots from the vagina.  You feel sick to your stomach (nauseous) and throw up (vomit). MAKE SURE YOU:  Understand these instructions.  Will watch your condition.  Will get help right away if you are not doing well or get worse.   This information is not intended to replace advice given to you by your health care provider. Make sure you discuss any questions you have with your health care provider.   Document Released: 06/09/2009 Document Revised: 08/17/2013 Document Reviewed: 03/11/2013 Elsevier Interactive Patient Education [employer].

## 2015-11-02 ENCOUNTER — Inpatient Hospital Stay (HOSPITAL_COMMUNITY)
Admission: AD | Admit: 2015-11-02 | Discharge: 2015-11-02 | Disposition: A | Discharge status: Home / Self Care | Payer: BC Managed Care – PPO | Source: Ambulatory Visit | Attending: Obstetrics and Gynecology | Admitting: Obstetrics and Gynecology

## 2015-11-02 DIAGNOSIS — B373 Candidiasis of vulva and vagina: Secondary | ICD-10-CM | POA: Diagnosis not present

## 2015-11-02 DIAGNOSIS — N898 Other specified noninflammatory disorders of vagina: Secondary | ICD-10-CM | POA: Diagnosis present

## 2015-11-02 DIAGNOSIS — B379 Candidiasis, unspecified: Secondary | ICD-10-CM

## 2015-11-02 LAB — WET PREP, GENITAL
Clue Cells Wet Prep HPF POC: NONE SEEN
Sperm: NONE SEEN
Trich, Wet Prep: NONE SEEN

## 2015-11-02 MED ORDER — FLUCONAZOLE 150 MG PO TABS: 150.0000 mg | ORAL_TABLET | Freq: Once | ORAL | Status: DC

## 2015-11-02 NOTE — MAU Note (Signed)
Pt reports vaginal itching and discharge for the last week

## 2015-11-02 NOTE — Discharge Instructions (Signed)

## 2015-11-02 NOTE — MAU Note (Signed)
Urine in lab 

## 2015-11-02 NOTE — MAU Provider Note (Signed)
History     CSN: [id number]  Arrival date and time: 11/02/15 [id number]   First Provider Initiated Contact with Patient 11/02/15 2214      Chief Complaint  Patient presents with  . Vaginal Discharge   Vaginal Discharge The patient's primary symptoms include vaginal discharge. This is a new problem. The current episode started in the past 7 days. The problem occurs constantly. The problem has been gradually worsening. Pain severity now: 5/10  She is not pregnant. Associated symptoms include dysuria. Pertinent negatives include no abdominal pain, chills, constipation, diarrhea, fever, frequency, nausea, urgency or vomiting. The vaginal discharge was thick and copious. There has been no bleeding. Nothing aggravates the symptoms. She has tried nothing for the symptoms. She uses nothing for contraception. Menstrual history: LMP: 10/08/15     Past Medical History  Diagnosis Date  . PONV (postoperative nausea and vomiting)   . Spinal headache   . Ganglion cyst of wrist 08/2011    left  . Asthma     as a child - no current med. or prob.  . Seasonal allergies   . Gestational diabetes   . Allergy   . HSV infection     Past Surgical History  Procedure Laterality Date  . Cesarean section  05/14/2005; 11/19/2007  . Ganglion cyst excision  10/01/2011    Procedure: REMOVAL GANGLION OF WRIST;  Surgeon: Cammie Sickle., MD;  Location: [place];  Service: Orthopedics;  Laterality: Left;  left wrist  . Cesarean section with bilateral tubal ligation Bilateral 05/31/2015    Procedure: CESAREAN SECTION WITH BILATERAL TUBAL LIGATION;  Surgeon: Newton Pigg, MD;  Location: [facility];  Service: Obstetrics;  Laterality: Bilateral;    No family history on file.  Social History  Substance Use Topics  . Smoking status: Never Smoker   . Smokeless tobacco: Never Used  . Alcohol Use: Yes     Comment: occasionally    Allergies: No Known Allergies  Prescriptions prior to admission   Medication Sig Dispense Refill Last Dose  . acetaminophen (TYLENOL) 500 MG tablet Take 500 mg by mouth every 6 (six) hours as needed for mild pain.   two weeks  . ibuprofen (ADVIL,MOTRIN) 600 MG tablet Take 1 tablet (600 mg total) by mouth every 6 (six) hours as needed for mild pain or cramping. 90 tablet 1     Review of Systems  Constitutional: Negative for fever and chills.  Gastrointestinal: Negative for nausea, vomiting, abdominal pain, diarrhea and constipation.  Genitourinary: Positive for dysuria and vaginal discharge. Negative for urgency and frequency.   Physical Exam   Blood pressure 126/72, pulse 71, temperature 98.3 F (36.8 C), temperature source Oral, resp. rate 16, height 5' 4.17" (1.63 m), weight 61.871 kg (136 lb 6.4 oz), unknown if currently breastfeeding.  Physical Exam  Nursing note and vitals reviewed. Constitutional: She is oriented to person, place, and time. She appears well-developed and well-nourished. No distress.  HENT:  Head: Normocephalic.  Cardiovascular: Normal rate.   Respiratory: Effort normal.  GI: Soft. There is no tenderness. There is no rebound.  Neurological: She is alert and oriented to person, place, and time.  Skin: Skin is warm and dry.  Psychiatric: She has a normal mood and affect.     Results for orders placed or performed during the hospital encounter of 11/02/15 (from the past 24 hour(s))  Wet prep, genital     Status: Abnormal   Collection Time: 11/02/15  9:35 PM  Result  Value Ref Range   Yeast Wet Prep HPF POC PRESENT (A) NONE SEEN   Trich, Wet Prep NONE SEEN NONE SEEN   Clue Cells Wet Prep HPF POC NONE SEEN NONE SEEN   WBC, Wet Prep HPF POC MANY (A) NONE SEEN   Sperm NONE SEEN      MAU Course  Procedures  MDM   Assessment and Plan   1. Yeast infection    DC home Comfort measures reviewed  RX: diflcuan #2 Return to MAU as needed   Follow-up Information    Follow up with Melina Schools, MD.   Specialty:   Obstetrics and Gynecology   Why:  If symptoms worsen   Contact information:   [address] [place] [address] [phone]         Mathis Bud 11/02/2015, 10:16 PM

## 2015-11-03 LAB — HIV ANTIBODY (ROUTINE TESTING W REFLEX): HIV SCREEN 4TH GENERATION: NONREACTIVE

## 2015-11-03 LAB — GC/CHLAMYDIA PROBE AMP (~~LOC~~) NOT AT ARMC
CHLAMYDIA, DNA PROBE: NEGATIVE
Neisseria Gonorrhea: NEGATIVE

## 2016-02-22 ENCOUNTER — Ambulatory Visit (HOSPITAL_COMMUNITY)
Admission: EM | Admit: 2016-02-22 | Discharge: 2016-02-22 | Disposition: A | Discharge status: Home / Self Care | Payer: BC Managed Care – PPO | Attending: Emergency Medicine | Admitting: Emergency Medicine

## 2016-02-22 ENCOUNTER — Encounter (HOSPITAL_COMMUNITY): Payer: Self-pay | Admitting: Emergency Medicine

## 2016-02-22 DIAGNOSIS — M2740 Unspecified cyst of jaw: Secondary | ICD-10-CM

## 2016-02-22 NOTE — ED Provider Notes (Signed)
CSN: [id number]     Arrival date & time 02/22/16  1931 History   First MD Initiated Contact with Patient 02/22/16 1959     Chief Complaint  Patient presents with  . Jaw Pain   (Consider location/radiation/quality/duration/timing/severity/associated sxs/prior Treatment) HPI History obtained from patient: Location:  Left lower jaw Context/Duration:  Hard knot left lower jaw for probable 1 month Severity: 2  Quality: Timing:       Constant     Home Treatment: None Associated symptoms:  None Family History: Diabetes    Past Medical History  Diagnosis Date  . PONV (postoperative nausea and vomiting)   . Spinal headache   . Ganglion cyst of wrist 08/2011    left  . Asthma     as a child - no current med. or prob.  . Seasonal allergies   . Gestational diabetes   . Allergy   . HSV infection    Past Surgical History  Procedure Laterality Date  . Cesarean section  05/14/2005; 11/19/2007  . Ganglion cyst excision  10/01/2011    Procedure: REMOVAL GANGLION OF WRIST;  Surgeon: Cammie Sickle., MD;  Location: [facility];  Service: Orthopedics;  Laterality: Left;  left wrist  . Cesarean section with bilateral tubal ligation Bilateral 05/31/2015    Procedure: CESAREAN SECTION WITH BILATERAL TUBAL LIGATION;  Surgeon: Newton Pigg, MD;  Location: [facility];  Service: Obstetrics;  Laterality: Bilateral;   History reviewed. No pertinent family history. Social History  Substance Use Topics  . Smoking status: Never Smoker   . Smokeless tobacco: Never Used  . Alcohol Use: Yes     Comment: occasionally   OB History    Gravida Para Term Preterm AB TAB SAB Ectopic Multiple Living   5 1 1  1 1    0 4     Review of Systems  Allergies  Review of patient's allergies indicates no known allergies.  Home Medications   Prior to Admission medications   Medication Sig Start Date End Date Taking? Authorizing Provider  acetaminophen (TYLENOL) 500 MG tablet Take 500 mg by mouth  every 6 (six) hours as needed for mild pain.    Historical Provider, MD  fluconazole (DIFLUCAN) 150 MG tablet Take 1 tablet (150 mg total) by mouth once. May repeat in 2 days if still having symptoms. 11/02/15   Tresea Mall, CNM  ibuprofen (ADVIL,MOTRIN) 600 MG tablet Take 1 tablet (600 mg total) by mouth every 6 (six) hours as needed for mild pain or cramping. 09/16/15   Caren Macadam, MD   Meds Ordered and Administered this Visit  Medications - No data to display  BP 118/73 mmHg  Pulse 84  Temp(Src) 99.1 F (37.3 C) (Oral)  Resp 16  SpO2 98%  LMP 01/25/2016 (Exact Date)  Breastfeeding? Yes No data found.   Physical Exam NURSES NOTES AND VITAL SIGNS REVIEWED. CONSTITUTIONAL: Well developed, well nourished, no acute distress HEENT: normocephalic, atraumatic, left lower jaw there is a marble sized firm to palpation lesion. Slight tenderness to palpation.  EYES: Conjunctiva normal NECK:normal ROM, supple, no adenopathy PULMONARY:No respiratory distress, normal effort ABDOMINAL: Soft, ND, NT BS+, No CVAT MUSCULOSKELETAL: Normal ROM of all extremities,  SKIN: warm and dry without rash PSYCHIATRIC: Mood and affect, behavior are normal  ED Course  Procedures (including critical care time)  Labs Review Labs Reviewed - No data to display  Imaging Review No results found.   Visual Acuity Review  Right Eye Distance:  Left Eye Distance:   Bilateral Distance:    Right Eye Near:   Left Eye Near:    Bilateral Near:         MDM   1. Cyst of jaw     Patient is reassured that there are no issues that require transfer to higher level of care at this time or additional tests. Patient is advised to continue home symptomatic treatment. Patient is advised that if there are new or worsening symptoms to attend the emergency department, contact primary care provider, or return to UC. Instructions of care provided discharged home in stable condition.    THIS NOTE WAS  GENERATED USING A VOICE RECOGNITION SOFTWARE PROGRAM. ALL REASONABLE EFFORTS  WERE MADE TO PROOFREAD THIS DOCUMENT FOR ACCURACY.  I have verbally reviewed the discharge instructions with the patient. A printed AVS was given to the patient.  All questions were answered prior to discharge.      Konrad Felix, PA 02/22/16 2014  Konrad Felix, [place] 02/22/16 2038

## 2016-02-22 NOTE — Discharge Instructions (Signed)
YOU WILL NEED A REFERRAL FROM YOUR PCP FOR EITHER A PLASTIC SURGEON OR DERMATOLOGIST TO REMOVE THIS LESION.  THERE ARE NO SIGNS OF ANY THING BAD.

## 2016-02-22 NOTE — ED Notes (Signed)
The patient presented to the [facility] with a complaint of a "knot" on the inside of her jaw that she reported has been present for 2 months but has started to hurt over the last 2 days.

## 2016-03-02 ENCOUNTER — Emergency Department (HOSPITAL_COMMUNITY)
Admission: EM | Admit: 2016-03-02 | Discharge: 2016-03-02 | Disposition: A | Discharge status: Home / Self Care | Payer: BC Managed Care – PPO | Attending: Emergency Medicine | Admitting: Emergency Medicine

## 2016-03-02 ENCOUNTER — Encounter (HOSPITAL_COMMUNITY): Payer: Self-pay | Admitting: *Deleted

## 2016-03-02 DIAGNOSIS — L089 Local infection of the skin and subcutaneous tissue, unspecified: Secondary | ICD-10-CM

## 2016-03-02 DIAGNOSIS — J45909 Unspecified asthma, uncomplicated: Secondary | ICD-10-CM | POA: Insufficient documentation

## 2016-03-02 DIAGNOSIS — L723 Sebaceous cyst: Secondary | ICD-10-CM | POA: Insufficient documentation

## 2016-03-02 DIAGNOSIS — Z79899 Other long term (current) drug therapy: Secondary | ICD-10-CM | POA: Diagnosis not present

## 2016-03-02 DIAGNOSIS — M272 Inflammatory conditions of jaws: Secondary | ICD-10-CM | POA: Diagnosis present

## 2016-03-02 MED ORDER — CEPHALEXIN 500 MG PO CAPS: 500.0000 mg | ORAL_CAPSULE | Freq: Two times a day (BID) | ORAL | Status: DC

## 2016-03-02 NOTE — Discharge Instructions (Signed)
Apply warm compresses to affected area throughout the day, or alternate between warm and cool compresses to help with pain and swelling. Take antibiotic until it is finished. Take tylenol and motrin as needed for pain. Followup with your Primary Care doctor in 2 days for recheck and with Dr. Migdalia Dk (plastic surgeon) in 1-2 weeks for ongoing management and ultimate removal of your sebaceous cyst. Monitor area for signs of infection to include, but not limited to: increasing pain, spreading redness, drainage/pus, worsening swelling, or fevers. Return to emergency department for emergent changing or worsening symptoms.    Epidermal Cyst An epidermal cyst is usually a small, painless lump under the skin. Cysts often occur on the face, neck, stomach, chest, or genitals. The cyst may be filled with a bad smelling paste. Do not pop your cyst. Popping the cyst can cause pain and puffiness (swelling). HOME CARE   Only take medicines as told by your doctor.  Take your medicine (antibiotics) as told. Finish it even if you start to feel better. GET HELP RIGHT AWAY IF:  Your cyst is tender, red, or puffy.  You are not getting better, or you are getting worse.  You have any questions or concerns. MAKE SURE YOU:  Understand these instructions.  Will watch your condition.  Will get help right away if you are not doing well or get worse.   This information is not intended to replace advice given to you by your health care provider. Make sure you discuss any questions you have with your health care provider.   Document Released: 09/19/2004 Document Revised: 02/11/2012 Document Reviewed: 02/18/2011 Elsevier Interactive Patient Education [employer].

## 2016-03-02 NOTE — ED Notes (Signed)
C/o enlarged area to left jaw - states has been present x 2 months - increasing in size and beginning to hurt x 1 week. Denies drainage.

## 2016-03-02 NOTE — ED Provider Notes (Signed)
CSN: [id number]     Arrival date & time 03/02/16  1305 History  By signing my name below, I, Dyke Brackett, attest that this documentation has been prepared under the direction and in the presence of non-physician practitioner, Zacarias Pontes, PA-C Electronically Signed: Dyke Brackett, Scribe. 03/02/2016. 1:51 PM   Chief Complaint  Patient presents with  . Abscess   The history is provided by the patient. No language interpreter was used.    HPI Comments:  [name] is a 35 y.o. female who presents to the Emergency Department complaining of swelling to the L side of her jaw. Pt states she's had a small ?cyst to the area for several months, at least 2, but that last week it started hurting and became more swollen. Pt was seen in urgent care last week where she was told she had a cyst, but that nothing further needed to be done, was not started on any medications for this. she states that since then, the area has become more swollen, red, hot to touch, and more painful. She describes the pain as 6/10 intermittent throbbing, radiating into the L ear, worse with palpation, and improved with ibuprofen 600mg . She denies dental pain, gingival swelling/drainage, ear drainage, trismus, drooling, difficulty swallowing, red streaking, drainage from the area, fevers, chills, CP, SOB, abd pain, N/V/D/C, hematuria, dysuria, myalgias, arthralgias, numbness, tingling, weakness, or rashes. Pt is a nonsmoker. No allergies to any medications. Pt is currently breastfeeding her 45 month old infant.   Past Medical History  Diagnosis Date  . PONV (postoperative nausea and vomiting)   . Spinal headache   . Ganglion cyst of wrist 08/2011    left  . Asthma     as a child - no current med. or prob.  . Seasonal allergies   . Gestational diabetes   . Allergy   . HSV infection    Past Surgical History  Procedure Laterality Date  . Cesarean section  05/14/2005; 11/19/2007  . Ganglion cyst excision  10/01/2011     Procedure: REMOVAL GANGLION OF WRIST;  Surgeon: Cammie Sickle., MD;  Location: [place] [facility];  Service: Orthopedics;  Laterality: Left;  left wrist  . Cesarean section with bilateral tubal ligation Bilateral 05/31/2015    Procedure: CESAREAN SECTION WITH BILATERAL TUBAL LIGATION;  Surgeon: Newton Pigg, MD;  Location: [facility];  Service: Obstetrics;  Laterality: Bilateral;   No family history on file. Social History  Substance Use Topics  . Smoking status: Never Smoker   . Smokeless tobacco: Never Used  . Alcohol Use: Yes     Comment: occasionally   OB History    Gravida Para Term Preterm AB TAB SAB Ectopic Multiple Living   5 1 1  1 1    0 4     Review of Systems  Constitutional: Negative for fever and chills.  HENT: Positive for ear pain and facial swelling. Negative for dental problem, drooling, ear discharge, sore throat and trouble swallowing.        Negative trismus   Respiratory: Negative for shortness of breath.   Cardiovascular: Negative for chest pain.  Gastrointestinal: Negative for nausea, vomiting, abdominal pain, diarrhea and constipation.  Genitourinary: Negative for dysuria and hematuria.  Musculoskeletal: Negative for myalgias and arthralgias.  Skin: Positive for color change (erythema). Negative for rash.  Allergic/Immunologic: Negative for immunocompromised state.  Neurological: Negative for weakness and numbness.  Psychiatric/Behavioral: Negative for confusion.   10 Systems reviewed and are negative for acute change  except as noted in the HPI.   Allergies  Review of patient's allergies indicates no known allergies.  Home Medications   Prior to Admission medications   Medication Sig Start Date End Date Taking? Authorizing Provider  acetaminophen (TYLENOL) 500 MG tablet Take 500 mg by mouth every 6 (six) hours as needed for mild pain.    Historical Provider, MD  fluconazole (DIFLUCAN) 150 MG tablet Take 1 tablet (150 mg total) by mouth  once. May repeat in 2 days if still having symptoms. 11/02/15   Tresea Mall, CNM  ibuprofen (ADVIL,MOTRIN) 600 MG tablet Take 1 tablet (600 mg total) by mouth every 6 (six) hours as needed for mild pain or cramping. 09/16/15   Caren Macadam, MD   BP 123/71 mmHg  Pulse 82  Temp(Src) 98.1 F (36.7 C) (Oral)  Resp 20  SpO2 100%  LMP 01/27/2016  Breastfeeding? Yes Physical Exam  Constitutional: She is oriented to person, place, and time. Vital signs are normal. She appears well-developed and well-nourished.  Non-toxic appearance. No distress.  Afebrile, nontoxic, NAD  HENT:  Head: Normocephalic and atraumatic.  Right Ear: Hearing, tympanic membrane, external ear and ear canal normal.  Left Ear: Hearing, tympanic membrane, external ear and ear canal normal.  Nose: Nose normal.  Mouth/Throat: Uvula is midline, oropharynx is clear and moist and mucous membranes are normal. No trismus in the jaw. No dental abscesses or uvula swelling.  Ears are clear bilaterally. Nose clear. Oropharynx clear and moist, without uvular swelling or deviation, no trismus or drooling, no tonsillar swelling or erythema, no exudates. No dental abscess appreciated, no gingival swelling or evidence of Ludwig's. Patent airway.  Left jaw with quarter sized spherical indurated, well demarkated ?cyst, somewhat rubbery feeling, mobile, with mild warmth to the touch and erythema over the cyst itself but not spreading outside the area, no fluctuance, no spreading facial cellulitis   Eyes: Conjunctivae and EOM are normal. Right eye exhibits no discharge. Left eye exhibits no discharge.  Neck: Normal range of motion. Neck supple.  Cardiovascular: Normal rate.   Pulmonary/Chest: Effort normal. No respiratory distress.  Abdominal: Normal appearance. She exhibits no distension.  Musculoskeletal: Normal range of motion.  Neurological: She is alert and oriented to person, place, and time. She has normal strength. No sensory  deficit.  Skin: Skin is warm, dry and intact. No rash noted. There is erythema.  Left jaw cyst as described above  Psychiatric: She has a normal mood and affect. Her behavior is normal.  Nursing note and vitals reviewed.   ED Course  Procedures  DIAGNOSTIC STUDIES:  Oxygen Saturation is 100% on RA, normal by my interpretation.    COORDINATION OF CARE:  1:45 PM Will order cephalexin. Discussed treatment plan with pt at bedside and pt agreed to plan.  Labs Review Labs Reviewed - No data to display  Imaging Review No results found. I have personally reviewed and evaluated these images and lab results as part of my medical decision-making.   EKG Interpretation None      MDM   Final diagnoses:  Infected sebaceous cyst of skin    36 y.o. female here with swollen area to L jaw, has been present x2 months but for 1wk it has gotten progressively more swollen, red, hot to touch, and painful. On exam, appears to be sebaceous cyst that is now becoming infected, doubt abscess given the rubbery texture. No fluctuance, doubt I&D would be of any utility in this patient, especially given  the location of the cyst. Discussed that we will tx with abx, and have her f/up with plastic surgeon for ultimate removal of this cyst. Tylenol/motrin for pain, ice/heat use. F/up with PCP in 2-3 days for recheck. Handling secretions well, but strict return precautions discussed for any compromise to airway or worsening/spreading infection. I explained the diagnosis and have given explicit precautions to return to the ER including for any other new or worsening symptoms. The patient understands and accepts the medical plan as it's been dictated and I have answered their questions. Discharge instructions concerning home care and prescriptions have been given. The patient is STABLE and is discharged to home in good condition.   I personally performed the services described in this documentation, which was scribed  in my presence. The recorded information has been reviewed and is accurate.  BP 123/71 mmHg  Pulse 82  Temp(Src) 98.1 F (36.7 C) (Oral)  Resp 20  SpO2 100%  LMP 01/27/2016  Breastfeeding? Yes  Meds ordered this encounter  Medications  . cephALEXin (KEFLEX) 500 MG capsule    Sig: Take 1 capsule (500 mg total) by mouth 2 (two) times daily. 1 caps po bid x 7 days    Dispense:  14 capsule    Refill:  0    Order Specific Question:  Supervising Provider    Answer:  Noemi Chapel [[id number]]         Sharetha Newson Camprubi-Soms, PA-C 03/02/16 [place], MD 03/04/16 1216

## 2016-11-13 ENCOUNTER — Inpatient Hospital Stay
Admit: 2016-11-13 | Discharge: 2016-11-13 | Disposition: A | Discharge status: Home / Self Care | Payer: PRIVATE HEALTH INSURANCE

## 2016-11-13 ENCOUNTER — Emergency Department: Admit: 2016-11-13 | Payer: PRIVATE HEALTH INSURANCE

## 2016-11-13 DIAGNOSIS — J40 Bronchitis, not specified as acute or chronic: Secondary | ICD-10-CM

## 2016-11-13 DIAGNOSIS — R05 Cough: Secondary | ICD-10-CM

## 2016-11-13 MED ORDER — azithromycin (ZITHROMAX) 250 MG tablet
250 | ORAL_TABLET | ORAL | 0 refills | Status: AC
Start: 2016-11-13 — End: ?

## 2016-11-13 MED ORDER — acetaminophen (TYLENOL) 325 MG tablet
325 | ORAL_TABLET | Freq: Four times a day (QID) | ORAL | 0 refills | 11.00000 days | Status: AC | PRN
Start: 2016-11-13 — End: ?

## 2016-11-13 MED ORDER — ibuprofen (ADVIL,MOTRIN) 600 MG tablet
600 | ORAL_TABLET | Freq: Four times a day (QID) | ORAL | 0 refills | Status: AC | PRN
Start: 2016-11-13 — End: ?

## 2016-11-13 MED ORDER — ketorolac (TORADOL) injection 15 mg
15 | Freq: Once | INTRAMUSCULAR | Status: AC
Start: 2016-11-13 — End: 2016-11-13

## 2016-11-13 MED ORDER — acetaminophen (TYLENOL) tablet 975 mg
325 | Freq: Once | ORAL | Status: AC
Start: 2016-11-13 — End: 2016-11-13
  Administered 2016-11-13: 12:00:00 975 mg via ORAL

## 2016-11-13 MED ORDER — benzonatate (TESSALON) capsule 100 mg
100 | Freq: Once | ORAL | Status: AC
Start: 2016-11-13 — End: 2016-11-13
  Administered 2016-11-13: 12:00:00 100 mg via ORAL

## 2016-11-13 MED ORDER — benzonatate (TESSALON PERLES) 100 MG capsule
100 | ORAL_CAPSULE | Freq: Three times a day (TID) | ORAL | 0 refills | Status: AC | PRN
Start: 2016-11-13 — End: ?

## 2016-11-13 MED FILL — TYLENOL 325 MG TABLET: 325 325 mg | ORAL | Qty: 3

## 2016-11-13 MED FILL — BENZONATATE 100 MG CAPSULE: 100 100 MG | ORAL | Qty: 1

## 2016-11-13 MED FILL — KETOROLAC 15 MG/ML INJECTION SOLUTION: 15 15 mg/mL | INTRAMUSCULAR | Qty: 1

## 2016-11-13 NOTE — Unmapped (Signed)
Plan of care discussed with pt by Tx team. Pt given DCD instructions and paper work. Pt given opportunity to express any concerns/ questions. Pt verbalizes understanding denies any questions or concerns at this time. Pt encouraged to follow up w/ PCP and to return to ER for any concerns or emergent health care needs.

## 2016-11-13 NOTE — Unmapped (Signed)
You were seen in the Emergency Department for cough. Your x-ray from earlier did not show a pneumonia. You can continue ibuprofen and tylenol and the earlier antibiotic you were prescribed.    Please follow up with primary care provider.    Please return to the ED if your symptoms worsen or do not resolve; or if you develop any chest pain, shortness of breath, abdominal pain, uncontrollable vomiting, unable to eat, passing out, difficulty moving your arms or legs, difficulty speaking, fever (greater than 100.4??F), or any concern that you feel needs acute physician evaluation.

## 2016-11-13 NOTE — Unmapped (Signed)
Seen in [facility] ED this morning for cough/sob x5 days. Discharged home this am with bronchitis diagnosis and rx for cough, antibiotic and ibuprofen. Denies improvement throughout the day today. Would like reevaluation. Able to speak in complete sentences without difficulty. Voice is hoarse.

## 2016-11-13 NOTE — ED Provider Notes (Signed)
ED Attending Attestation Note    Date of service:  11/13/2016    This patient was seen by the resident physician.  I have seen and examined the patient, agree with the workup, evaluation, management and diagnosis. The care plan has been discussed and I concur.     My assessment reveals a 36 y.o. female presenting to the emergency department for blood in her sputum. Clear breath sounds bilaterally, no chest pain, PERC negative, hemodynamically stable, and with a clear posterior oropharynx without evidence of laceration. Given the frequency of coughing this is consistent with viral bronchitis.

## 2016-11-13 NOTE — Unmapped (Signed)
Please follow-up with your primary care physician    Take Motrin and Tylenol for pain and fever    Continue taking your Mucinex    Return to the emergency Department with any worsening symptoms or any other emergent questions or concerns

## 2016-11-13 NOTE — Unmapped (Signed)
[place] ED Note    Date of Service: 11/13/2016    Reason for Visit: Cough      Patient History     HPI:  This is a 36 y.o. female with history of STD presenting with cough and congestion in her chest.    Patient has been having cough for the last 5 days, this is slightly worsening.  She noticed that her chest feels congested as well and coughs up clear phlegm.  She was seen in the emergency department earlier today had a x-ray done which did not show acute pulmonary process.  She had had some fevers at home which has come down with ibuprofen and Tylenol.  She was given azithromycin prescription earlier.  She returned because she was worried because there was some pink possibly blood in her clear phlegm.  She denies chest pain except when she coughs really hard.  She notes that she has had a sore throat and runny nose as well.  She has not had any leg swelling.  She is not had any recent surgeries, long travel, she has no history of blood clots, she is not on hormones.    No nausea, vomiting, abdominal pain, chest pain, rash.    With the exception of above, the patient denies any aggravating or alleviating factors as well as any other associated signs or symptoms.      Past Medical History:   Diagnosis Date   ??? Gestational diabetes     with first pregnancy and was on metformin for 1 year post-partum in 2008   ??? STD (sexually transmitted disease)     unsure but was treated       Past Surgical History:   Procedure Laterality Date   ??? ADENOIDECTOMY      as a child   ??? Adnoid Removal      as child   ??? CESAREAN SECTION      x 2   ??? DIAGNOSTIC LAPAROSCOPY Right 11/04/2013    Procedure: LAPAROSCOPY DIAGNOSTIC FOR ECTOPIC, removal of right fallopian tube for ectopic pregnancy;  Surgeon: Alva Garnet, MD;  Location: [facility];  Service: Gynecology;  Laterality: Right;       TREONNA KLEE  reports that she has been smoking Cigarettes.  She has been smoking about 1.00  pack per day. She has never used smokeless tobacco. She reports that she uses drugs, including Marijuana. She reports that she does not drink alcohol.    Patient's Medications   New Prescriptions    No medications on file   Previous Medications    ACETAMINOPHEN (TYLENOL) 325 MG TABLET    Take 2 tablets (650 mg total) by mouth every 6 hours as needed.    AZITHROMYCIN (ZITHROMAX) 250 MG TABLET    Take 500mg  (2 tabs) po today, then 250mg  (1 tab) po daily for the next four days (days 2-5).    BENZONATATE (TESSALON PERLES) 100 MG CAPSULE    Take 1 capsule (100 mg total) by mouth 3 times a day as needed for Cough.    IBUPROFEN (ADVIL,MOTRIN) 600 MG TABLET    Take 1 tablet (600 mg total) by mouth every 6 hours as needed for Pain.    OXYCODONE-ACETAMINOPHEN (PERCOCET) 5-325 MG PER TABLET    Take 1 tablet by mouth every 6 hours as needed.    PEDIATRIC MULTIVIT COMB. NO.49 (FLINTSTONES GUMMIES ORAL)    Take by mouth.   Modified Medications    No medications on file  Discontinued Medications    No medications on file       Allergies:   Allergies as of 11/13/2016 - Fully Reviewed 11/13/2016   Allergen Reaction Noted   ??? Penicillins Hives        PMH: Nursing notes reviewed   PSH: Nursing notes reviewed   FH: Nursing notes reviewed   MEDS: Nursing notes and chart reviewed         Review of Systems     Review of Systems   Constitutional: Positive for fever. Negative for chills.   HENT: Positive for sore throat.    Eyes: Negative for pain.   Respiratory: Positive for cough. Negative for shortness of breath and wheezing.    Cardiovascular: Negative for chest pain.   Gastrointestinal: Negative for abdominal pain, nausea and vomiting.   Genitourinary: Negative for dysuria.   Musculoskeletal: Negative for neck pain.   Skin: Negative for rash.   Neurological: Negative for weakness and headaches.       Physical Exam     Vitals:    11/13/16 1301   BP: 113/75   BP Location: Right arm   Patient Position: Sitting   Pulse: 69   Resp: 18    Temp: 99.1 ??F (37.3 ??C)   TempSrc: Oral   SpO2: 99%       Physical Exam   Constitutional: She is oriented to person, place, and time and well-developed, well-nourished, and in no distress. No distress.   HENT:   Head: Normocephalic and atraumatic.   Mouth/Throat: No oropharyngeal exudate.   Uvula is midline, no trismus, no drooling   Eyes: Conjunctivae and EOM are normal.   Neck: Neck supple.   Cardiovascular: Normal rate, regular rhythm and normal heart sounds.    Pulmonary/Chest: Effort normal and breath sounds normal. No stridor. No respiratory distress. She has no wheezes. She has no rales.   Abdominal: Soft. She exhibits no distension. There is no tenderness. There is no rebound and no guarding.   Musculoskeletal: She exhibits no edema.   Neurological: She is alert and oriented to person, place, and time.   Skin: Skin is warm and dry. No rash noted. She is not diaphoretic.         Diagnostic Studies     Labs:    Labs Reviewed - No data to display    Radiology:    Please see electronic medical record for any tests performed in the ED    No orders to display         EKG:    na         Emergency Department Procedures     na    ED Course and MDM     TALEYAH HILLMAN is a 36 y.o. female with a history and presentation as described above in HPI.  The patient was evaluated by myself and the ED Attending Physician, Dr. Lynnae January. All management and disposition plans were discussed and agreed upon.    Upon presentation, the patient was well-appearing, afebrile and hemodynamically stable. He was seen earlier in the emergency department for some her complaints.  She had x-ray done which did not show pneumonia.  She has had 5 days of slightly worsening sore throat cough and congestion in her chest.  She does not have risk factors for PE such as recent surgeries, long travel, she can use, past blood clots.    On exam, she is hemodynamically stable in no acute distress.  She is nontoxic-appearing.  There is no wheezing  or crackles in her lungs.  Her heart rate is not tachycardic and no abnormal rhythm.  There is no exudates on her tonsils.  Ears mild erythema.  Her uvula is midline.  She has no trismus or drooling.  There is no swelling noted in her airway.  She is breathing and speaking without any difficulty.    He does not have any chest pain right now.  She does not have shortness of breath.  She returned because she is worried surgical pink tinge in her phlegm that she coughed up.  I have low suspicion for pulmonary embolism given that she has no risk factors and is not complaining of chest pain and has good vital signs.    This is likely a viral etiology.  Low suspicion for bacterial infection either in her throat or lungs at the present time.  She was given return precautions.  She understands to continue fluids and she can take every perform Tylenol for symptom relief.  She can continue the azithromycin prescription she got earlier today.    Risks, benefits, and alternatives were discussed. At this time the patient has been deemed safe for discharge. My customary discharge instructions including strict return precautions for worsening or new symptoms have been communicated.      Consults:    NA    Summary of Treatment in ED:    Medications - No data to display        Impression     1. Cough         Plan       1. The patient is to be discharged home in stable/improved condition.  2. Workup, treatment and diagnosis were discussed with the patient and/or family members; the patient agrees to the plan and all questions were addressed and answered.  3. The patient is instructed to return to the emergency department should her symptoms worsen or any concern she believes warrants acute physician evaluation.      Diamond Nickel, MD, PGY-1  [facility]         Johnsie Cancel, MD  Resident  11/13/16 [phone]

## 2016-11-13 NOTE — Unmapped (Signed)
[facility] ED Note    Date of service:  (Not on file)    Reason for Visit: Cough and Generalized Body Aches      Patient History     HPI  This is a 36 year old female who smokes cigars and marijuana but denies any other drug use who presents to the emergency department today with a chief complaint of 5 days of cough and low-grade fever at home upwards of 102.  [facility] and was started on albuterol and Mucinex.  She did not have a chest x-ray performed at that time.  She has also noted generalized body aches and chest pain only with coughing that she notes is reproducible in nature.  She has had no shortness of breath, headaches, neck pain, lightheadedness, syncope, abdominal pain, nausea, vomiting, sore throats, voice changes, but she has had some nasal congestion.  Her symptoms have been going on for a total of 5 days.  She has had no recent travel outside the country and his had no leg swelling.  She has also noted diffuse bodyaches but nothing focal.The patient denies any pregnancy.    Past Medical History:   Diagnosis Date   ??? Gestational diabetes     with first pregnancy and was on metformin for 1 year post-partum in 2008   ??? STD (sexually transmitted disease)     unsure but was treated       Past Surgical History:   Procedure Laterality Date   ??? ADENOIDECTOMY      as a child   ??? Adnoid Removal      as child   ??? CESAREAN SECTION      x 2   ??? DIAGNOSTIC LAPAROSCOPY Right 11/04/2013    Procedure: LAPAROSCOPY DIAGNOSTIC FOR ECTOPIC, removal of right fallopian tube for ectopic pregnancy;  Surgeon: Alva Garnet, MD;  Location: [facility];  Service: Gynecology;  Laterality: Right;       Patient  reports that she has been smoking Cigarettes.  She has been smoking about 1.00 pack per day. She does not have any smokeless tobacco history on file. She reports that she uses drugs, including Marijuana. She reports that she  does not drink alcohol.      Previous Medications    OXYCODONE-ACETAMINOPHEN (PERCOCET) 5-325 MG PER TABLET    Take 1 tablet by mouth every 6 hours as needed.    PEDIATRIC MULTIVIT COMB. NO.49 (FLINTSTONES GUMMIES ORAL)    Take by mouth.       Allergies:   Allergies as of 11/13/2016 - Fully Reviewed 11/13/2016   Allergen Reaction Noted   ??? Penicillins Hives        Review of Systems     Review of Systems   Constitutional: Positive for chills, fatigue and fever.   HENT: Positive for congestion, postnasal drip, rhinorrhea and sinus pressure. Negative for dental problem, drooling, mouth sores, sore throat, trouble swallowing and voice change.    Eyes: Negative for photophobia and visual disturbance.   Respiratory: Positive for cough. Negative for chest tightness, shortness of breath, wheezing and stridor.    Cardiovascular: Positive for chest pain (only with coughing). Negative for palpitations and leg swelling.   Gastrointestinal: Negative for abdominal pain, constipation, diarrhea, nausea and vomiting.   Genitourinary: Negative for dysuria, flank pain and frequency.   Musculoskeletal: Negative for arthralgias, back pain, neck pain and neck stiffness.   Skin: Negative for rash and wound.  Neurological: Negative for dizziness, tremors, seizures, syncope, facial asymmetry, speech difficulty, weakness, light-headedness, numbness and headaches.   Hematological: Negative for adenopathy.           Physical Exam     ED Triage Vitals   Vital Signs Group      Temp       Temp src       Pulse       Heart Rate Source       Resp       SpO2       BP       MAP (mmHg)       BP Location       BP Method       Patient Position    SpO2    O2 Device        Physical Exam   Nursing note and vitals reviewed.  Constitutional: She appears well-developed and well-nourished.   HENT:   Head: Normocephalic and atraumatic.   Mouth/Throat: No oropharyngeal exudate.   Eyes: EOM are normal. Pupils are equal, round, and reactive to light.   Neck:  Normal range of motion. Neck supple.   Pulmonary/Chest: Effort normal. No respiratory distress. She has no wheezes. She has no rales. She exhibits tenderness (anterior and right-sided).   Abdominal: Soft. She exhibits no distension. There is no tenderness.   Musculoskeletal: Normal range of motion. She exhibits no edema or tenderness.   Neurological: She is oriented to person, place, time and situation.  Normal speech without aphasia or dysarthria.  Gait is normal without ataxia.    Skin: Skin is warm and dry. No erythema.   Psychiatric: She has a normal mood and affect.   Patient is awake, alert, oriented, no nuchal rigidity, GCS of 15      Diagnostic Studies     Labs:    No tests were performed during this ED visit    Radiology:    Please see electronic medical record for any tests performed in the ED    EKG:    IndicationAn EKG was performed demonstrating normal sinus rhythm at a rate of 76 bpm with normal axis and intervals with no evidence of acute ischemia or dysrhythmia    Emergency Department Procedures     Procedures    ED Course and MDM     [address]NN [address] is a 36 y.o. female who presented to the emergency department with Cough and Generalized Body Aches      ED Course        This is a 36 year old female who presents to the emergency department today with the above complaints.  She is not taking Motrin or Tylenol for temperature was 100.3 today.  She was not tachycardic with no evidence of sepsis.  She is otherwise very well-appearing with no evidence of wheezes.  A chest x-ray will be performed.  Regarding the patient's chest pain, and EKG will be performed this is reproducible in nature and associated with coughing so I think this is more likely to be related to musculoskeletal strain from coughing.  Last 5 days.Even if this was influenza the patient is at 5 days with no evidence of acute respiratory, circumflex were labwork or influenza testing.  She is outside the window for any further treatment  with Tamiflu. The patient has no wheezing that would require a course of oral steroids.  The patient refused the Toradol today electing only to receive the Tylenol.Given the nature of the patient's  discomfort acute coronary syndrome seems less likely.  She is PERC negative.  With no evidence of dissection.    CXR-  IMPRESSION:    No acute cardiopulmonary disease.    The patient had no evidence of acute abnormality on chest x-ray.  She remained very well-appearing throughout her emergency permit state.  She was discharged in stable condition with strict return precautions and follow-up plan in place.    Impression:  1.  Bronchitis       Critical Care Time (Attendings)          Sharlene Motts, MD  11/15/16 [phone]

## 2016-12-08 ENCOUNTER — Emergency Department (HOSPITAL_COMMUNITY): Payer: BC Managed Care – PPO

## 2016-12-08 ENCOUNTER — Encounter (HOSPITAL_COMMUNITY): Payer: Self-pay | Admitting: Emergency Medicine

## 2016-12-08 ENCOUNTER — Emergency Department (HOSPITAL_COMMUNITY)
Admission: EM | Admit: 2016-12-08 | Discharge: 2016-12-08 | Disposition: A | Discharge status: Home / Self Care | Payer: BC Managed Care – PPO | Attending: Emergency Medicine | Admitting: Emergency Medicine

## 2016-12-08 DIAGNOSIS — J45909 Unspecified asthma, uncomplicated: Secondary | ICD-10-CM | POA: Insufficient documentation

## 2016-12-08 DIAGNOSIS — R071 Chest pain on breathing: Secondary | ICD-10-CM | POA: Insufficient documentation

## 2016-12-08 DIAGNOSIS — R079 Chest pain, unspecified: Secondary | ICD-10-CM

## 2016-12-08 LAB — BASIC METABOLIC PANEL
Anion gap: 5 (ref 5–15)
BUN: 10 mg/dL (ref 6–20)
CO2: 24 mmol/L (ref 22–32)
Calcium: 9 mg/dL (ref 8.9–10.3)
Chloride: 107 mmol/L (ref 101–111)
Creatinine, Ser: 0.69 mg/dL (ref 0.44–1.00)
GLUCOSE: 95 mg/dL (ref 65–99)
POTASSIUM: 3.6 mmol/L (ref 3.5–5.1)
Sodium: 136 mmol/L (ref 135–145)

## 2016-12-08 LAB — CBC
HEMATOCRIT: 32.8 % — AB (ref 36.0–46.0)
HEMOGLOBIN: 10.6 g/dL — AB (ref 12.0–15.0)
MCH: 26.8 pg (ref 26.0–34.0)
MCHC: 32.3 g/dL (ref 30.0–36.0)
MCV: 83 fL (ref 78.0–100.0)
Platelets: 235 10*3/uL (ref 150–400)
RBC: 3.95 MIL/uL (ref 3.87–5.11)
RDW: 15.9 % — ABNORMAL HIGH (ref 11.5–15.5)
WBC: 5.1 10*3/uL (ref 4.0–10.5)

## 2016-12-08 LAB — I-STAT TROPONIN, ED
Troponin i, poc: 0 ng/mL (ref 0.00–0.08)
Troponin i, poc: 0 ng/mL (ref 0.00–0.08)

## 2016-12-08 NOTE — ED Provider Notes (Signed)
[facility] DEPT Provider Note   CSN: [id number] Arrival date & time: 12/08/16  1450     History   Chief Complaint Chief Complaint  Patient presents with  . Chest Pain    HPI [name] is a 36 y.o. female.  The history is provided by the patient and medical records.  Chest Pain   This is a new problem. The current episode started 6 to 12 hours ago. The problem occurs constantly. Progression since onset: Better, but still present. The pain is associated with rest. The pain is present in the lateral region (right). The pain is moderate. The quality of the pain is described as sharp. The pain does not radiate. The symptoms are aggravated by deep breathing. Associated symptoms include cough, numbness and shortness of breath. Pertinent negatives include no abdominal pain, no back pain, no diaphoresis, no dizziness, no fever, no headaches, no irregular heartbeat, no lower extremity edema, no nausea, no palpitations, no vomiting and no weakness. She has tried rest for the symptoms. The treatment provided no relief. There are no known risk factors.  Pertinent negatives for past medical history include no CAD, no hyperlipidemia, no hypertension, no recent injury and no seizures.  Pertinent negatives for family medical history include: no CAD.    Past Medical History:  Diagnosis Date  . Allergy   . Asthma    as a child - no current med. or prob.  . Ganglion cyst of wrist 08/2011   left  . Gestational diabetes   . HSV infection   . PONV (postoperative nausea and vomiting)   . Seasonal allergies   . Spinal headache     Patient Active Problem List   Diagnosis Date Noted  . H/O cesarean section 05/31/2015    Past Surgical History:  Procedure Laterality Date  . CESAREAN SECTION  05/14/2005; 11/19/2007  . CESAREAN SECTION WITH BILATERAL TUBAL LIGATION Bilateral 05/31/2015   Procedure: CESAREAN SECTION WITH BILATERAL TUBAL LIGATION;  Surgeon: Newton Pigg, MD;  Location: [facility];   Service: Obstetrics;  Laterality: Bilateral;  . GANGLION CYST EXCISION  10/01/2011   Procedure: REMOVAL GANGLION OF WRIST;  Surgeon: Cammie Sickle., MD;  Location: [facility];  Service: Orthopedics;  Laterality: Left;  left wrist    OB History    Gravida Para Term Preterm AB Living   5 1 1   1 4    SAB TAB Ectopic Multiple Live Births     1   0 1       Home Medications    Prior to Admission medications   Medication Sig Start Date End Date Taking? Authorizing Provider  acetaminophen (TYLENOL) 500 MG tablet Take 500 mg by mouth every 6 (six) hours as needed for mild pain.   Yes Historical Provider, MD  ibuprofen (ADVIL,MOTRIN) 600 MG tablet Take 1 tablet (600 mg total) by mouth every 6 (six) hours as needed for mild pain or cramping. 09/16/15  Yes Caren Macadam, MD    Family History No family history on file.  Social History Social History  Substance Use Topics  . Smoking status: Never Smoker  . Smokeless tobacco: Never Used  . Alcohol use Yes     Comment: occasionally     Allergies   Patient has no known allergies.   Review of Systems Review of Systems  Constitutional: Negative for chills, diaphoresis and fever.  HENT: Negative for ear pain and sore throat.   Eyes: Negative for pain and visual disturbance.  Respiratory: Positive for cough and shortness of breath.   Cardiovascular: Positive for chest pain. Negative for palpitations.  Gastrointestinal: Negative for abdominal pain, nausea and vomiting.  Genitourinary: Negative for dysuria and hematuria.  Musculoskeletal: Negative for arthralgias and back pain.  Skin: Negative for color change and rash.  Neurological: Positive for numbness. Negative for dizziness, seizures, syncope, weakness and headaches.  All other systems reviewed and are negative.    Physical Exam Updated Vital Signs BP 113/67   Pulse 82   Temp 98.2 F (36.8 C) (Oral)   Resp 20   Ht 5\' 3"  (1.6 m)   Wt 61.2 kg    LMP 10/31/2016   SpO2 100%   BMI 23.91 kg/m   Physical Exam  Constitutional: She is oriented to person, place, and time. She appears well-developed and well-nourished. No distress.  HENT:  Head: Normocephalic and atraumatic.  Eyes: Conjunctivae are normal.  Neck: Neck supple.  Cardiovascular: Normal rate and regular rhythm.   No murmur heard. Pulmonary/Chest: Effort normal and breath sounds normal. No respiratory distress.  Abdominal: Soft. There is no tenderness.  Musculoskeletal: She exhibits no edema.  Neurological: She is alert and oriented to person, place, and time.  5/5 strength in all 4ext, normal sensation throughout  Skin: Skin is warm and dry.  Psychiatric: She has a normal mood and affect.  Nursing note and vitals reviewed.    ED Treatments / Results  Labs (all labs ordered are listed, but only abnormal results are displayed) Labs Reviewed  CBC - Abnormal; Notable for the following:       Result Value   Hemoglobin 10.6 (*)    HCT 32.8 (*)    RDW 15.9 (*)    All other components within normal limits  BASIC METABOLIC PANEL  I-STAT TROPOININ, ED  I-STAT TROPOININ, ED    EKG  EKG Interpretation  Date/Time:  Sunday December 08 2016 14:58:07 EDT Ventricular Rate:  97 PR Interval:  154 QRS Duration: 76 QT Interval:  324 QTC Calculation: 411 R Axis:   79 Text Interpretation:  Normal sinus rhythm Normal ECG No significant change since last tracing Confirmed by Ashok Cordia  MD, Lennette Bihari ([id number]) on 12/08/2016 3:33:03 PM       Radiology Dg Chest 2 View  Result Date: 12/08/2016 CLINICAL DATA:  Right-sided chest pain with shortness of Breath EXAM: CHEST  2 VIEW COMPARISON:  06/09/2013 FINDINGS: The heart size and mediastinal contours are within normal limits. Both lungs are clear. The visualized skeletal structures are unremarkable. IMPRESSION: No active cardiopulmonary disease. Electronically Signed   By: Inez Catalina M.D.   On: 12/08/2016 16:52     Procedures Procedures (including critical care time)  Medications Ordered in ED Medications - No data to display   Initial Impression / Assessment and Plan / ED Course  I have reviewed the triage vital signs and the nursing notes.  Pertinent labs & imaging results that were available during my care of the patient were reviewed by me and considered in my medical decision making (see chart for details).    Pt presents with CP. Reports onset of sharp, right-sided CP this morning; says it is constant but improving, and is associated with SOB & intermittent RUE paresthesias. She does note having a mild cough for the last week, but attributes that to her seasonal allergies. Denies F/C, HA, lightheadedness, diaphoresis, N/V, urinary symptoms, or recent illness. No cardiac risk factors. Not on hormonal therapy. No recent travel.  VS & exam as  above. EKG: NSR @ 97bpm w/o signs of ischemia. HEART Score 3. CXR WNL. Labs unremarkable, including 2x troponins. PERC negative. Cause of the Pt's symptoms unclear, but doubt emergent etiology given history, exam, and workup.  Explained all results to the Pt. Will discharge the Pt home. Recommending follow-up with PCP. ED return precautions provided. Pt acknowledged understanding of, and concurrence with the plan. All questions answered to her satisfaction. In stable condition at the time of discharge.  Final Clinical Impressions(s) / ED Diagnoses   Final diagnoses:  Chest pain, unspecified type    New Prescriptions Discharge Medication List as of 12/08/2016  6:16 PM       Jenny Reichmann, MD 12/09/16 0120    Lajean Saver, MD 12/11/16 1527

## 2016-12-08 NOTE — ED Notes (Signed)
Patient verbalized understanding of discharge instructions and denies any further needs or questions at this time. VS stable. Patient ambulatory with steady gait.  

## 2016-12-08 NOTE — ED Notes (Signed)
MD at bedside. 

## 2016-12-08 NOTE — ED Notes (Signed)
Pt transported to xray via stretcher

## 2016-12-08 NOTE — ED Notes (Signed)
Pt returned to room and placed back on monitor.  

## 2017-04-06 ENCOUNTER — Encounter (HOSPITAL_COMMUNITY): Payer: Self-pay | Admitting: Nurse Practitioner

## 2017-04-06 ENCOUNTER — Emergency Department (HOSPITAL_COMMUNITY): Payer: BC Managed Care – PPO

## 2017-04-06 ENCOUNTER — Emergency Department (HOSPITAL_COMMUNITY)
Admission: EM | Admit: 2017-04-06 | Discharge: 2017-04-06 | Disposition: A | Discharge status: Home / Self Care | Payer: BC Managed Care – PPO | Attending: Emergency Medicine | Admitting: Emergency Medicine

## 2017-04-06 DIAGNOSIS — Y939 Activity, unspecified: Secondary | ICD-10-CM | POA: Diagnosis not present

## 2017-04-06 DIAGNOSIS — Y999 Unspecified external cause status: Secondary | ICD-10-CM | POA: Diagnosis not present

## 2017-04-06 DIAGNOSIS — X509XXA Other and unspecified overexertion or strenuous movements or postures, initial encounter: Secondary | ICD-10-CM | POA: Insufficient documentation

## 2017-04-06 DIAGNOSIS — S39012A Strain of muscle, fascia and tendon of lower back, initial encounter: Secondary | ICD-10-CM | POA: Diagnosis not present

## 2017-04-06 DIAGNOSIS — J45909 Unspecified asthma, uncomplicated: Secondary | ICD-10-CM | POA: Diagnosis not present

## 2017-04-06 DIAGNOSIS — Y929 Unspecified place or not applicable: Secondary | ICD-10-CM | POA: Insufficient documentation

## 2017-04-06 DIAGNOSIS — S3992XA Unspecified injury of lower back, initial encounter: Secondary | ICD-10-CM | POA: Diagnosis present

## 2017-04-06 LAB — POC URINE PREG, ED: Preg Test, Ur: NEGATIVE

## 2017-04-06 MED ORDER — DICLOFENAC SODIUM 50 MG PO TBEC: 50.0000 mg | DELAYED_RELEASE_TABLET | Freq: Two times a day (BID) | ORAL | 0 refills | Status: DC

## 2017-04-06 MED ORDER — CYCLOBENZAPRINE HCL 5 MG PO TABS: 5.0000 mg | ORAL_TABLET | Freq: Three times a day (TID) | ORAL | 0 refills | Status: DC | PRN

## 2017-04-06 NOTE — ED Provider Notes (Signed)
[facility] DEPT Provider Note   CSN: [id number] Arrival date & time: 04/06/17  1657   By signing my name below, I, [name], attest that this documentation has been prepared under the direction and in the presence of [facility], [place]. Electronically signed, [name], ED Scribe. 04/06/17. 6:47 PM.   History   Chief Complaint Chief Complaint  Patient presents with  . Back Pain   The history is provided by the patient and medical records. No language interpreter was used.    [name] is a 36 y.o. female with h/o spinal headache presenting to the Emergency Department concerning new onset, gradually worsening low back pain x 3 weeks. She describes moderate to severe, fluctuating pains worse with certain positions, bending over, lifting and walking up and down steps. In triage, pt stated that her symptoms radiate to bilateral lower extremities occasionally. Ibuprofen provides mild to moderate relief. 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 Triage note reports h/o sciatic nerve problems. No suspicion of pregnancy; pt states she has had a tubal ligation. No h/o similar symptoms. No known trauma/ injury. No dysuria, frequency, N/V, fever, chills, incontinence, chest or abdominal pain. No other complaints at this time.   Past Medical History:  Diagnosis Date  . Allergy   . Asthma    as a child - no current med. or prob.  . Ganglion cyst of wrist 08/2011   left  . Gestational diabetes   . HSV infection   . PONV (postoperative nausea and vomiting)   . Seasonal allergies   . Spinal headache     Patient Active Problem List   Diagnosis Date Noted  . H/O cesarean section 05/31/2015    Past Surgical History:  Procedure Laterality Date  . CESAREAN SECTION  05/14/2005; 11/19/2007  . CESAREAN SECTION WITH BILATERAL TUBAL LIGATION Bilateral 05/31/2015   Procedure: CESAREAN SECTION WITH BILATERAL TUBAL LIGATION;  Surgeon: Newton Pigg, MD;  Location: [facility];  Service: Obstetrics;   Laterality: Bilateral;  . GANGLION CYST EXCISION  10/01/2011   Procedure: REMOVAL GANGLION OF WRIST;  Surgeon: Cammie Sickle., MD;  Location: [facility];  Service: Orthopedics;  Laterality: Left;  left wrist    OB History    Gravida Para Term Preterm AB Living   5 1 1   1 4    SAB TAB Ectopic Multiple Live Births     1   0 1       Home Medications    Prior to Admission medications   Medication Sig Start Date End Date Taking? Authorizing Provider  acetaminophen (TYLENOL) 500 MG tablet Take 500 mg by mouth every 6 (six) hours as needed for mild pain.    [provider]  cyclobenzaprine (FLEXERIL) 5 MG tablet Take 1 tablet (5 mg total) by mouth 3 (three) times daily as needed for muscle spasms. 04/06/17   Ashley Murrain, NP  diclofenac (VOLTAREN) 50 MG EC tablet Take 1 tablet (50 mg total) by mouth 2 (two) times daily. 04/06/17   Ashley Murrain, NP  ibuprofen (ADVIL,MOTRIN) 600 MG tablet Take 1 tablet (600 mg total) by mouth every 6 (six) hours as needed for mild pain or cramping. 09/16/15   Caren Macadam, MD    Family History History reviewed. No pertinent family history.  Social History Social History  Substance Use Topics  . Smoking status: Never Smoker  . Smokeless tobacco: Never Used  . Alcohol use Yes     Comment:  occasionally     Allergies   Patient has no known allergies.   Review of Systems Review of Systems  Constitutional: Negative for chills and fever.  HENT: Negative.   Cardiovascular: Negative for chest pain.  Gastrointestinal: Negative for abdominal pain, nausea and vomiting.  Genitourinary: Negative for difficulty urinating, dysuria, frequency and urgency.  Musculoskeletal: Positive for arthralgias, back pain and myalgias. Negative for gait problem, joint swelling, neck pain and neck stiffness.  Skin: Negative for color change and wound.  Neurological: Negative for weakness.  Psychiatric/Behavioral: The patient is not  nervous/anxious.      Physical Exam Updated Vital Signs BP 137/74 (BP Location: Right Arm)   Pulse 94   Temp 98.1 F (36.7 C) (Oral)   Resp 18   SpO2 97%   Physical Exam  Constitutional: She appears well-developed and well-nourished. No distress.  HENT:  Head: Normocephalic and atraumatic.  Right Ear: Tympanic membrane normal.  Left Ear: Tympanic membrane normal.  Nose: Nose normal.  Mouth/Throat: Uvula is midline, oropharynx is clear and moist and mucous membranes are normal.  Eyes: EOM are normal.  Neck: Normal range of motion. Neck supple.  Cardiovascular: Normal rate and regular rhythm.   Pulmonary/Chest: Effort normal. She has no wheezes. She has no rales.  Abdominal: Soft. There is no tenderness.  Musculoskeletal:       Lumbar back: She exhibits tenderness and spasm. She exhibits normal pulse.  Neurological: She is alert. She has normal strength. No sensory deficit. Gait normal.  Reflex Scores:      Bicep reflexes are 2+ on the right side and 2+ on the left side.      Brachioradialis reflexes are 2+ on the right side and 2+ on the left side.      Patellar reflexes are 2+ on the right side and 2+ on the left side. Skin: Skin is warm and dry.  Psychiatric: She has a normal mood and affect. Her behavior is normal.  Nursing note and vitals reviewed.    ED Treatments / Results  DIAGNOSTIC STUDIES: Oxygen Saturation is 97% on RA, NL by my interpretation.    COORDINATION OF CARE: 6:38 PM-Discussed next steps with pt. Pt verbalized understanding and is agreeable with the plan. Will order imaging.   Labs (all labs ordered are listed, but only abnormal results are displayed) Labs Reviewed  POC URINE PREG, ED   Radiology Dg Lumbar Spine Complete  Result Date: 04/06/2017 CLINICAL DATA:  36 year old female with intermittent lumbar pain radiating transversely for the past 3 weeks. No history of trauma. Bilateral leg numbness. EXAM: LUMBAR SPINE - COMPLETE 4+ VIEW  COMPARISON:  None. FINDINGS: There is no evidence of lumbar spine fracture. Alignment is normal. Intervertebral disc spaces are maintained. IMPRESSION: Negative. Electronically Signed   By: Vinnie Langton M.D.   On: 04/06/2017 20:44    Procedures Procedures (including critical care time)  Medications Ordered in ED Medications - No data to display   Initial Impression / Assessment and Plan / ED Course  I have reviewed the triage vital signs and the nursing notes.  Patient with back pain. Normal lumbar spine x-rays.  No neurological deficits and normal neuro exam.  Patient is ambulatory.  No loss of bowel or bladder control.  No concern for cauda equina.  No fever, night sweats, weight loss, h/o cancer, IVDA, no recent procedure to back. No urinary symptoms suggestive of UTI.  Supportive care and return precaution discussed. Appears safe for discharge at this time. Follow  up as indicated in discharge paperwork.      Final Clinical Impressions(s) / ED Diagnoses   Final diagnoses:  Lumbosacral strain, initial encounter    New Prescriptions New Prescriptions   CYCLOBENZAPRINE (FLEXERIL) 5 MG TABLET    Take 1 tablet (5 mg total) by mouth 3 (three) times daily as needed for muscle spasms.   DICLOFENAC (VOLTAREN) 50 MG EC TABLET    Take 1 tablet (50 mg total) by mouth 2 (two) times daily.   I personally performed the services described in this documentation, which was scribed in my presence. The recorded information has been reviewed and is accurate.    Debroah Baller [place], [place] 04/06/17 2052    Lacretia Leigh, MD 04/07/17 [phone]

## 2017-04-06 NOTE — ED Triage Notes (Signed)
Pt is c/o intermittent lower back pain that has been ongoing for the last 3 weeks. Denies bladder or bowel loss control but reports incidences of the pain radiating to lower extremities in throbbing manner and some numbness. Remarks on hx of sciatic nerve problems.

## 2017-04-06 NOTE — Discharge Instructions (Signed)
Do not take the muscle relaxant if driving as it will make you sleepy.

## 2018-01-19 IMAGING — CR DG LUMBAR SPINE COMPLETE 4+V
5 series · 5 of 5 positions shown · non-contrast
Comparison: None.

CLINICAL DATA: 35-year-old female with intermittent lumbar pain
radiating transversely for the past 3 weeks. No history of trauma.
Bilateral leg numbness.

EXAM:
LUMBAR SPINE - COMPLETE 4+ VIEW

[t lumbar spine ap]
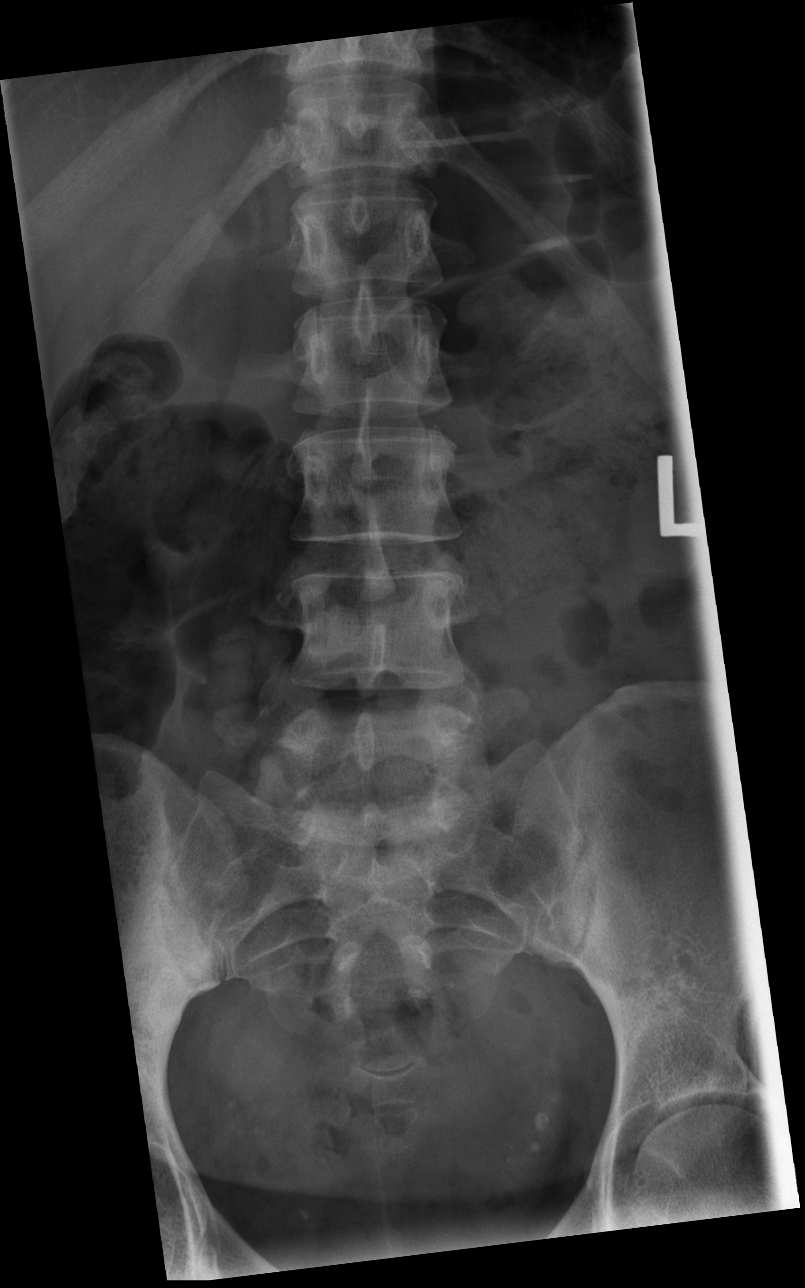

[t lumbar spine obl (1 of 2)]
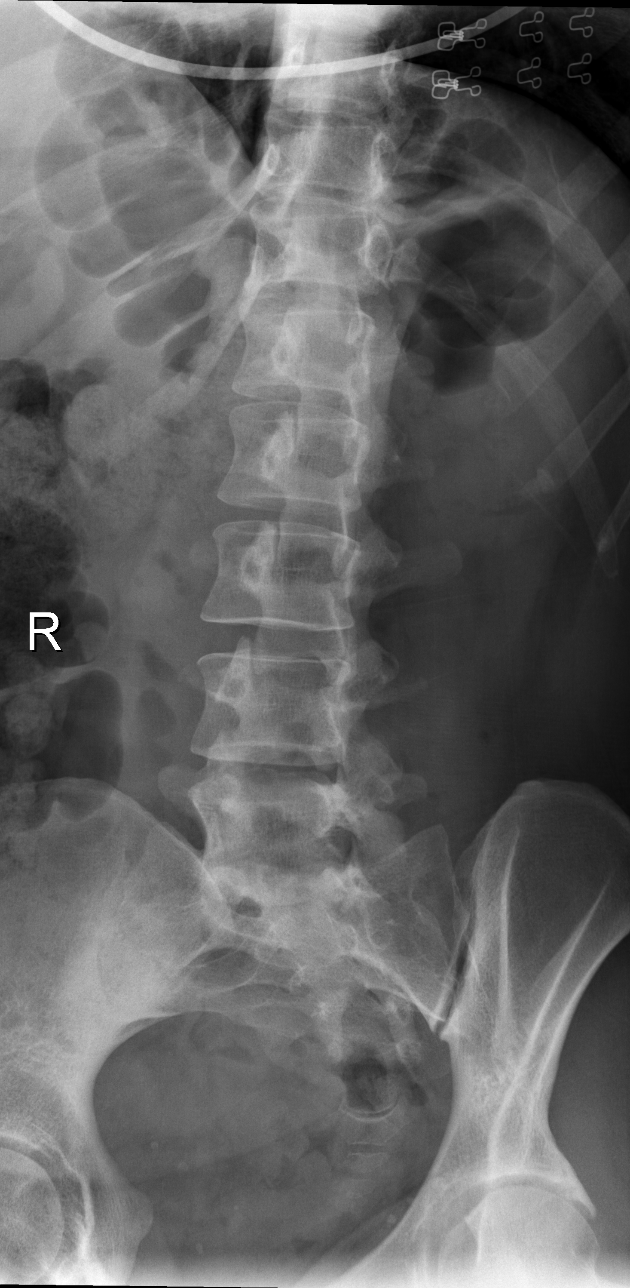

[t lumbar spine obl (2 of 2)]
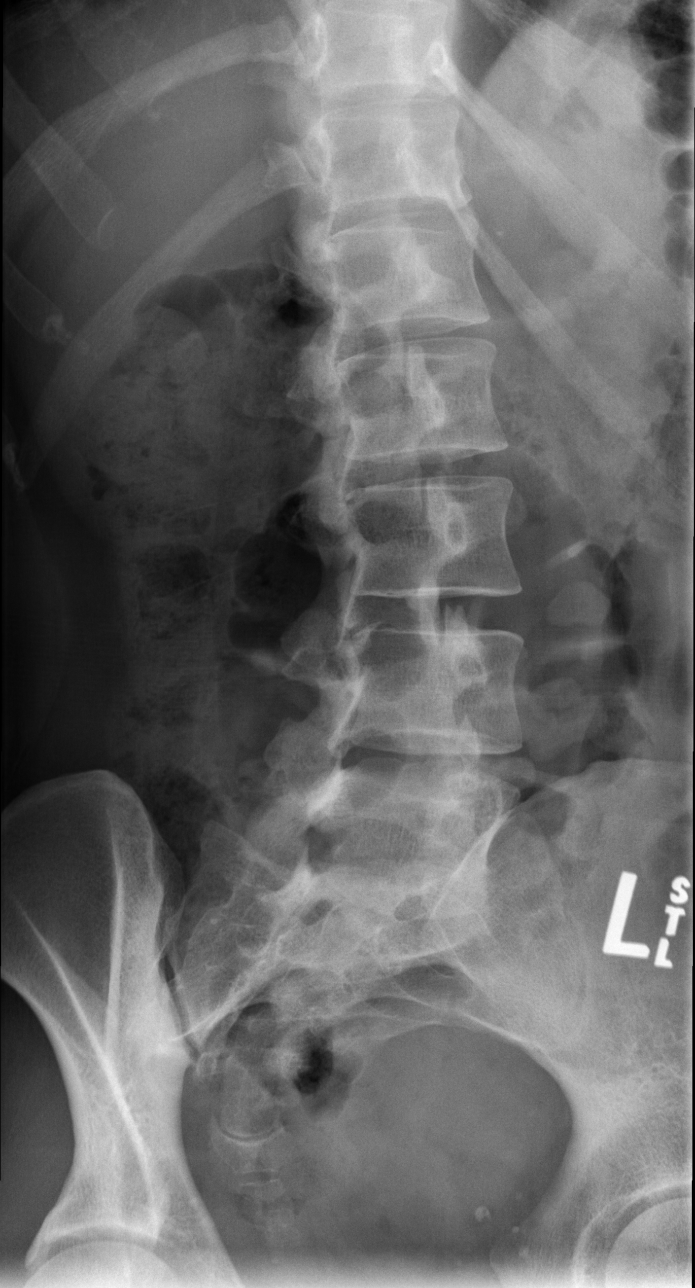

[t lumbar spine lat]
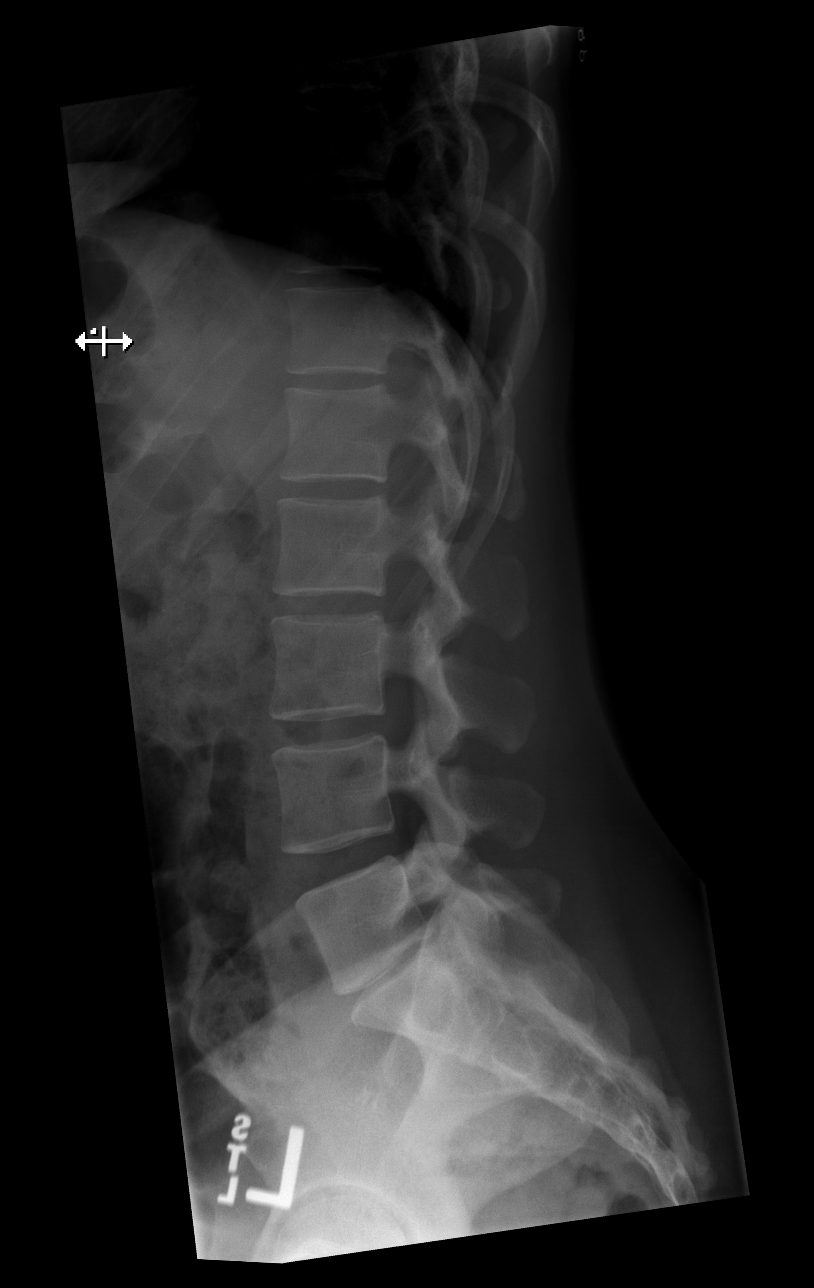

[t lumbar l-5 s-1 spot]
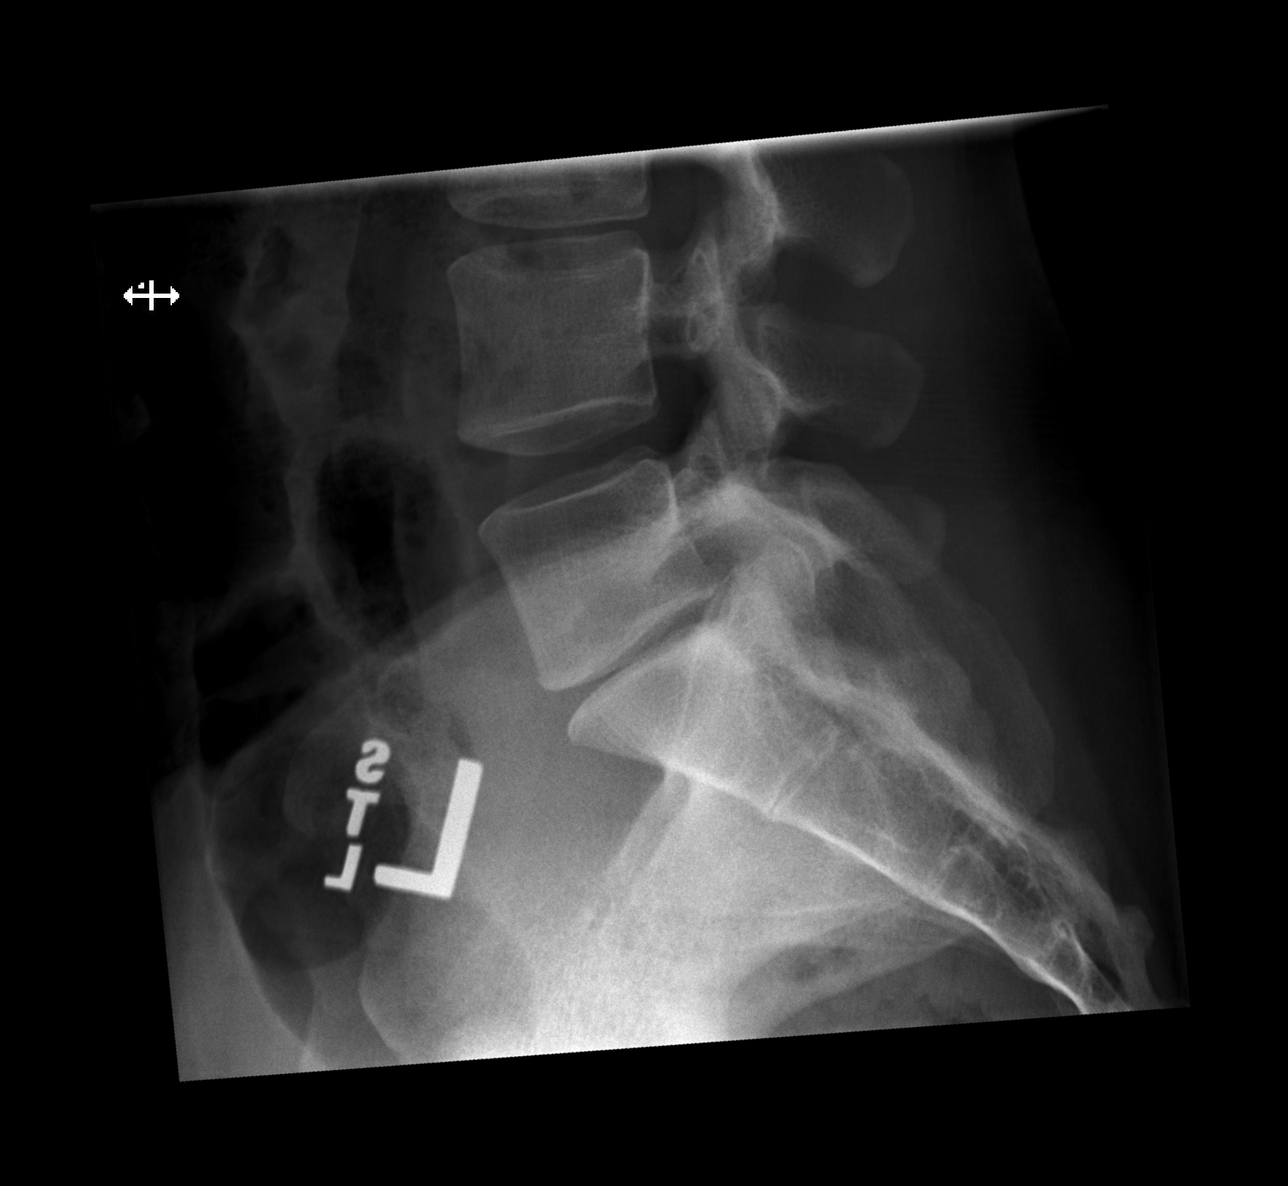

[5 of 5 positions shown; findings below may reference images not displayed]

FINDINGS: There is no evidence of lumbar spine fracture. Alignment is normal.
Intervertebral disc spaces are maintained.
IMPRESSION: Negative.

## 2018-06-19 ENCOUNTER — Encounter: Payer: Self-pay | Admitting: Gastroenterology

## 2018-07-01 ENCOUNTER — Encounter: Payer: Self-pay | Admitting: Gastroenterology

## 2018-07-01 ENCOUNTER — Ambulatory Visit: Payer: BC Managed Care – PPO | Admitting: Gastroenterology

## 2018-07-01 VITALS — BP 110/70 | HR 68 | Ht 63.0 in | Wt 135.6 lb

## 2018-07-01 DIAGNOSIS — R195 Other fecal abnormalities: Secondary | ICD-10-CM

## 2018-07-01 DIAGNOSIS — D509 Iron deficiency anemia, unspecified: Secondary | ICD-10-CM | POA: Diagnosis not present

## 2018-07-01 MED ORDER — NA SULFATE-K SULFATE-MG SULF 17.5-3.13-1.6 GM/177ML PO SOLN: 1.0000 | Freq: Once | ORAL | 0 refills | Status: AC

## 2018-07-01 NOTE — Progress Notes (Signed)
HPI :  37 year old female with a history of iron deficiency anemia, childhood asthma and seasonal allergies, referred here by Aura Dials, MD for iron deficiency anemia and occult positive stools.   She reports she has been told she has an iron deficiency anemia. No recent labs on file at our office, she states she told her hemoglobin was around 9 with iron deficiency. She denies any overt blood in her stools. She did have a positive Hemoccult stool. She has some constipation at times, since being on oral iron her stools are dark brown. She has bowel movement roughly every other day or so. She denies any abdominal pains. No nausea, no vomiting, no postprandial pains. No reflux symptoms, no dysphagia. She denies a family history of colon cancer or gastric cancer.  She does endorse a history of uterine fibroids with heavy menstrual periods, she takes ibuprofen for this periodically but not routinely. She has occasional headaches as well for which she uses it. She has never had a prior endoscopic evaluation.    Past Medical History:  Diagnosis Date  . Allergy   . Asthma    as a child - no current med. or prob.  . Ganglion cyst of wrist 08/2011   left  . Gestational diabetes   . HSV infection   . PONV (postoperative nausea and vomiting)   . Seasonal allergies   . Spinal headache      Past Surgical History:  Procedure Laterality Date  . CESAREAN SECTION  05/14/2005; 11/19/2007  . CESAREAN SECTION WITH BILATERAL TUBAL LIGATION Bilateral 05/31/2015   Procedure: CESAREAN SECTION WITH BILATERAL TUBAL LIGATION;  Surgeon: Newton Pigg, MD;  Location: [facility];  Service: Obstetrics;  Laterality: Bilateral;  . GANGLION CYST EXCISION  10/01/2011   Procedure: REMOVAL GANGLION OF WRIST;  Surgeon: Cammie Sickle., MD;  Location: [facility];  Service: Orthopedics;  Laterality: Left;  left wrist   Family History  Problem Relation Age of Onset  . Hypertension Mother    Social  History   Tobacco Use  . Smoking status: Never Smoker  . Smokeless tobacco: Never Used  Substance Use Topics  . Alcohol use: Yes    Comment: occasionally  . Drug use: No   Current Outpatient Medications  Medication Sig Dispense Refill  . acetaminophen (TYLENOL) 500 MG tablet Take 500 mg by mouth every 6 (six) hours as needed for mild pain.    Marland Kitchen ibuprofen (ADVIL,MOTRIN) 600 MG tablet Take 1 tablet (600 mg total) by mouth every 6 (six) hours as needed for mild pain or cramping. 90 tablet 1   No current facility-administered medications for this visit.    No Known Allergies   Review of Systems: All systems reviewed and negative except where noted in HPI.   Lab Results  Component Value Date   WBC 5.1 12/08/2016   HGB 10.6 (L) 12/08/2016   HCT 32.8 (L) 12/08/2016   MCV 83.0 12/08/2016   PLT 235 12/08/2016    No results found for: IRON, TIBC, FERRITIN   Physical Exam: BP 110/70   Pulse 68   Ht 5\' 3"  (1.6 m)   Wt 135 lb 9.6 oz (61.5 kg)   LMP 06/05/2018 (Approximate)   BMI 24.02 kg/m  Constitutional: Pleasant,well-developed, female in no acute distress. HEENT: Normocephalic and atraumatic. Conjunctivae are normal. No scleral icterus. Neck supple.  Cardiovascular: Normal rate, regular rhythm.  Pulmonary/chest: Effort normal and breath sounds normal. No wheezing, rales or rhonchi. Abdominal: Soft, nondistended,  nontender. There are no masses palpable. No hepatomegaly. Extremities: no edema Lymphadenopathy: No cervical adenopathy noted. Neurological: Alert and oriented to person place and time. Skin: Skin is warm and dry. No rashes noted. Psychiatric: Normal mood and affect. Behavior is normal.   ASSESSMENT AND PLAN: 37 year old female here for new patient visit regarding following issues:  Iron deficiency anemia / occult positive stool - discussed differential for iron deficiency anemia with her. Based off her history I suspect this could be related to her  menorrhagia that she endorses, however given the positive stool test, I offered her an upper endoscopy and colonoscopy to clear her GI tract to ensure no pathology there that could be related to this. I discussed risks and benefits of endoscopy and colonoscopy with her and she wanted to proceed. Further recommendations pending the results. If these exams are negative, would have her follow up for management of menorrhagia and see if that improves her anemia. She agreed.  [place] Cellar, MD [facility]  CC: Aura Dials, MD

## 2018-07-01 NOTE — Patient Instructions (Signed)

## 2018-07-02 ENCOUNTER — Encounter: Payer: Self-pay | Admitting: Gastroenterology

## 2018-07-09 ENCOUNTER — Ambulatory Visit (AMBULATORY_SURGERY_CENTER): Payer: BC Managed Care – PPO | Admitting: Gastroenterology

## 2018-07-09 ENCOUNTER — Encounter: Payer: Self-pay | Admitting: Gastroenterology

## 2018-07-09 ENCOUNTER — Other Ambulatory Visit: Payer: Self-pay

## 2018-07-09 VITALS — BP 115/59 | HR 77 | Temp 98.2°F | Resp 12 | Ht 63.0 in | Wt 135.0 lb

## 2018-07-09 DIAGNOSIS — K5289 Other specified noninfective gastroenteritis and colitis: Secondary | ICD-10-CM

## 2018-07-09 DIAGNOSIS — D123 Benign neoplasm of transverse colon: Secondary | ICD-10-CM

## 2018-07-09 DIAGNOSIS — K2970 Gastritis, unspecified, without bleeding: Secondary | ICD-10-CM | POA: Diagnosis not present

## 2018-07-09 DIAGNOSIS — D125 Benign neoplasm of sigmoid colon: Secondary | ICD-10-CM | POA: Diagnosis not present

## 2018-07-09 DIAGNOSIS — K633 Ulcer of intestine: Secondary | ICD-10-CM

## 2018-07-09 DIAGNOSIS — R195 Other fecal abnormalities: Secondary | ICD-10-CM

## 2018-07-09 DIAGNOSIS — K3189 Other diseases of stomach and duodenum: Secondary | ICD-10-CM | POA: Diagnosis not present

## 2018-07-09 DIAGNOSIS — D509 Iron deficiency anemia, unspecified: Secondary | ICD-10-CM

## 2018-07-09 MED ORDER — SODIUM CHLORIDE 0.9 % IV SOLN: 500.0000 mL | Freq: Once | INTRAVENOUS | Status: DC

## 2018-07-09 NOTE — Op Note (Signed)
[facility] Patient Name: [name] Procedure Date: 07/09/2018 1:20 PM MRN: [id number] Endoscopist: Remo Lipps P. Havery Moros , MD Age: 37 Referring MD:  Date of Birth: 05-Oct-1980 Gender: Female Account #: [id number] Procedure:                Upper GI endoscopy Indications:              Iron deficiency anemia, heme positive stool Medicines:                Monitored Anesthesia Care Procedure:                Pre-Anesthesia Assessment:                           - Prior to the procedure, a History and Physical                            was performed, and patient medications and                            allergies were reviewed. The patient's tolerance of                            previous anesthesia was also reviewed. The risks                            and benefits of the procedure and the sedation                            options and risks were discussed with the patient.                            All questions were answered, and informed consent                            was obtained. Prior Anticoagulants: The patient has                            taken no previous anticoagulant or antiplatelet                            agents. ASA Grade Assessment: II - A patient with                            mild systemic disease. After reviewing the risks                            and benefits, the patient was deemed in                            satisfactory condition to undergo the procedure.                           After obtaining informed consent, the endoscope was  passed under direct vision. Throughout the                            procedure, the patient's blood pressure, pulse, and                            oxygen saturations were monitored continuously. The                            Endoscope was introduced through the mouth, and                            advanced to the second part of duodenum. The upper   GI endoscopy was accomplished without difficulty.                            The patient tolerated the procedure well. Scope In: Scope Out: Findings:                 Esophagogastric landmarks were identified: the                            Z-line was found at 38 cm, the gastroesophageal                            junction was found at 38 cm and the upper extent of                            the gastric folds was found at 38 cm from the                            incisors.                           The exam of the esophagus was otherwise normal.                           The entire examined stomach was normal.                           Biopsies were taken with a cold forceps in the                            gastric body, at the incisura and in the gastric                            antrum for Helicobacter pylori testing in light of                            iron deficiency.                           The duodenal bulb and second portion of the  duodenum were normal. Complications:            No immediate complications. Estimated blood loss:                            Minimal. Estimated Blood Loss:     Estimated blood loss was minimal. Impression:               - Esophagogastric landmarks identified.                           - Normal esophagus.                           - Normal stomach. Biopsies taken to rule out H                            pylori in light of iron deficiency                           - Normal duodenal bulb and second portion of the                            duodenum. Recommendation:           - Patient has a contact number available for                            emergencies. The signs and symptoms of potential                            delayed complications were discussed with the                            patient. Return to normal activities tomorrow.                            Written discharge instructions were provided to the                             patient.                           - Resume previous diet.                           - Continue present medications.                           - Await pathology results. Remo Lipps P. [name], MD 07/09/2018 2:10:19 PM This report has been signed electronically.

## 2018-07-09 NOTE — Patient Instructions (Signed)
YOU HAD AN ENDOSCOPIC PROCEDURE TODAY AT THE [facility]:   Refer to the procedure report that was given to you for any specific questions about what was found during the examination.  If the procedure report does not answer your questions, please call your gastroenterologist to clarify.  If you requested that your care partner not be given the details of your procedure findings, then the procedure report has been included in a sealed envelope for you to review at your convenience later.  YOU SHOULD EXPECT: Some feelings of bloating in the abdomen. Passage of more gas than usual.  Walking can help get rid of the air that was put into your GI tract during the procedure and reduce the bloating. If you had a lower endoscopy (such as a colonoscopy or flexible sigmoidoscopy) you may notice spotting of blood in your stool or on the toilet paper. If you underwent a bowel prep for your procedure, you may not have a normal bowel movement for a few days.  Please Note:  You might notice some irritation and congestion in your nose or some drainage.  This is from the oxygen used during your procedure.  There is no need for concern and it should clear up in a day or so.  SYMPTOMS TO REPORT IMMEDIATELY:   Following lower endoscopy (colonoscopy or flexible sigmoidoscopy):  Excessive amounts of blood in the stool  Significant tenderness or worsening of abdominal pains  Swelling of the abdomen that is new, acute  Fever of 100F or higher   Following upper endoscopy (EGD)  Vomiting of blood or coffee ground material  New chest pain or pain under the shoulder blades  Painful or persistently difficult swallowing  New shortness of breath  Fever of 100F or higher  Black, tarry-looking stools  For urgent or emergent issues, a gastroenterologist can be reached at any hour by calling [phone].   DIET:  We do recommend a small meal at first, but then you may proceed to your regular diet.  Drink  plenty of fluids but you should avoid alcoholic beverages for 24 hours.  ACTIVITY:  You should plan to take it easy for the rest of today and you should NOT DRIVE or use heavy machinery until tomorrow (because of the sedation medicines used during the test).    FOLLOW UP: Our staff will call the number listed on your records the next business day following your procedure to check on you and address any questions or concerns that you may have regarding the information given to you following your procedure. If we do not reach you, we will leave a message.  However, if you are feeling well and you are not experiencing any problems, there is no need to return our call.  We will assume that you have returned to your regular daily activities without incident.  If any biopsies were taken you will be contacted by phone or by letter within the next 1-3 weeks.  Please call us at [phone] if you have not heard about the biopsies in 3 weeks.    SIGNATURES/CONFIDENTIALITY: You and/or your care partner have signed paperwork which will be entered into your electronic medical record.  These signatures attest to the fact that that the information above on your After Visit Summary has been reviewed and is understood.  Full responsibility of the confidentiality of this discharge information lies with you and/or your care-partner.  Polyp information given. 

## 2018-07-09 NOTE — Progress Notes (Signed)
Called to room to assist during endoscopic procedure.  Patient ID and intended procedure confirmed with present staff. Received instructions for my participation in the procedure from the performing physician.  

## 2018-07-09 NOTE — Progress Notes (Signed)
To PACU, VSS. Report to [email or web address] 

## 2018-07-09 NOTE — Op Note (Addendum)
[facility] Patient Name: [name] Procedure Date: 07/09/2018 1:20 PM MRN: [id number] Endoscopist: Remo Lipps P. Havery Moros , MD Age: 37 Referring MD:  Date of Birth: 1980/12/07 Gender: Female Account #: [id number] Procedure:                Colonoscopy Indications:              Iron deficiency anemia Medicines:                Monitored Anesthesia Care Procedure:                Pre-Anesthesia Assessment:                           - Prior to the procedure, a History and Physical                            was performed, and patient medications and                            allergies were reviewed. The patient's tolerance of                            previous anesthesia was also reviewed. The risks                            and benefits of the procedure and the sedation                            options and risks were discussed with the patient.                            All questions were answered, and informed consent                            was obtained. Prior Anticoagulants: The patient has                            taken no previous anticoagulant or antiplatelet                            agents. ASA Grade Assessment: II - A patient with                            mild systemic disease. After reviewing the risks                            and benefits, the patient was deemed in                            satisfactory condition to undergo the procedure.                           After obtaining informed consent, the colonoscope  was passed under direct vision. Throughout the                            procedure, the patient's blood pressure, pulse, and                            oxygen saturations were monitored continuously. The                            Colonoscope was introduced through the anus and                            advanced to the the terminal ileum, with                            identification of the appendiceal  orifice and IC                            valve. The colonoscopy was performed without                            difficulty. The patient tolerated the procedure                            well. The quality of the bowel preparation was                            good. The terminal ileum, ileocecal valve,                            appendiceal orifice, and rectum were photographed. Scope In: 1:40:00 PM Scope Out: 2:00:14 PM Scope Withdrawal Time: 0 hours 17 minutes 5 seconds  Total Procedure Duration: 0 hours 20 minutes 14 seconds  Findings:                 The digital rectal exam was remarkable for what                            appeared to be extrinsic compression of the rectum                            on the anterior wall.                           Patchy inflammation, mild in severity and                            characterized by aphthous ulcerations was found in                            the terminal ileum and IC valve. Biopsies were                            taken with a cold forceps for histology.  A 3 mm polyp was found in the transverse colon. The                            polyp was sessile. The polyp was removed with a                            cold snare. Resection and retrieval were complete.                           A 3 mm polyp was found in the sigmoid colon. The                            polyp was sessile. The polyp was removed with a                            cold snare. Resection and retrieval were complete.                           The exam was otherwise without abnormality. Complications:            No immediate complications. Estimated blood loss:                            Minimal. Estimated Blood Loss:     Estimated blood loss was minimal. Impression:               - Suspected extrinsic compression of rectum along                            anterior wall.                           - Ileitis. Biopsied. Ddx includes NSAID related                             changes versus Crohn's.                           - One 3 mm polyp in the transverse colon, removed                            with a cold snare. Resected and retrieved.                           - One 3 mm polyp in the sigmoid colon, removed with                            a cold snare. Resected and retrieved.                           - The examination was otherwise normal. Recommendation:           - Patient has a contact number available for  emergencies. The signs and symptoms of potential                            delayed complications were discussed with the                            patient. Return to normal activities tomorrow.                            Written discharge instructions were provided to the                            patient.                           - Resume previous diet.                           - Continue present medications.                           - Await pathology results.                           - Consideration for pelvic US given rectal exam                            findings. Will discuss with patient, she will                            follow up with her Gynecologist to discuss this [name]. [name], MD 07/09/2018 2:07:53 PM This report has been signed electronically.

## 2018-07-10 ENCOUNTER — Telehealth: Payer: Self-pay | Admitting: *Deleted

## 2018-07-10 ENCOUNTER — Telehealth: Payer: Self-pay

## 2018-07-10 NOTE — Telephone Encounter (Signed)
  Follow up Call-  Call back number 07/09/2018  Post procedure Call Back phone  # [phone]  Permission to leave phone message Yes  Some recent data might be hidden     This number is for an [employer] and is not open at this time of the am

## 2018-07-10 NOTE — Telephone Encounter (Signed)
Second follow up call attempt.  Work number provided transferred me to [employer], therefore no message left.

## 2018-07-17 ENCOUNTER — Other Ambulatory Visit: Payer: Self-pay

## 2018-07-17 DIAGNOSIS — D509 Iron deficiency anemia, unspecified: Secondary | ICD-10-CM

## 2019-11-04 ENCOUNTER — Ambulatory Visit: Payer: BC Managed Care – PPO | Attending: Internal Medicine

## 2019-11-04 DIAGNOSIS — Z23 Encounter for immunization: Secondary | ICD-10-CM

## 2019-11-04 NOTE — Progress Notes (Signed)
   Covid-19 Vaccination Clinic  Name:  [name]    MRN: [id number] DOB: 1980/12/21  11/04/2019  [name] was observed post Covid-19 immunization for 15 minutes without incident. She was provided with Vaccine Information Sheet and instruction to access the V-Safe system.   [name] was instructed to call 911 with any severe reactions post vaccine: Marland Kitchen Difficulty breathing  . Swelling of face and throat  . A fast heartbeat  . A bad rash all over body  . Dizziness and weakness   Immunizations Administered    Name Date Dose VIS Date Route   Pfizer COVID-19 Vaccine 11/04/2019  4:13 PM 0.3 mL 08/06/2019 Intramuscular   Manufacturer: [place]   Lot: [id number]   [place]: [id number]

## 2019-11-29 ENCOUNTER — Ambulatory Visit: Payer: BC Managed Care – PPO | Attending: Internal Medicine

## 2019-11-29 DIAGNOSIS — Z23 Encounter for immunization: Secondary | ICD-10-CM

## 2019-11-29 NOTE — Progress Notes (Signed)
   Covid-19 Vaccination Clinic  Name:  [name]    MRN: [id number] DOB: Apr 05, 1981  11/29/2019  [name] was observed post Covid-19 immunization for 15 minutes without incident. She was provided with Vaccine Information Sheet and instruction to access the V-Safe system.   [name] was instructed to call 911 with any severe reactions post vaccine: Marland Kitchen Difficulty breathing  . Swelling of face and throat  . A fast heartbeat  . A bad rash all over body  . Dizziness and weakness   Immunizations Administered    Name Date Dose VIS Date Route   Pfizer COVID-19 Vaccine 11/29/2019  4:11 PM 0.3 mL 08/06/2019 Intramuscular   Manufacturer: [employer]   Lot: [id number]   [place]: [id number]

## 2020-08-08 ENCOUNTER — Other Ambulatory Visit: Payer: BC Managed Care – PPO

## 2020-08-08 DIAGNOSIS — Z20822 Contact with and (suspected) exposure to covid-19: Secondary | ICD-10-CM

## 2020-08-09 LAB — SARS-COV-2, NAA 2 DAY TAT

## 2020-08-09 LAB — NOVEL CORONAVIRUS, NAA: SARS-CoV-2, NAA: DETECTED — AB

## 2020-08-10 ENCOUNTER — Telehealth: Payer: Self-pay | Admitting: Oncology

## 2020-08-10 ENCOUNTER — Encounter: Payer: Self-pay | Admitting: Oncology

## 2020-08-10 NOTE — Telephone Encounter (Signed)
Re: [facility]  Called to Discuss with patient about Covid symptoms and the use of regeneron, a monoclonal antibody infusion for those with mild to moderate Covid symptoms and at a high risk of hospitalization.     Pt is qualified for this infusion at the [facility] due to co-morbid conditions and/or a member of an at-risk group.    Past Medical History:  Diagnosis Date  . Allergy   . Anemia   . Asthma    as a child - no current med. or prob.  . Diabetes mellitus without complication ([place])   . Ganglion cyst of wrist 08/2011   left  . HSV infection   . PONV (postoperative nausea and vomiting)   . Seasonal allergies   . Spinal headache     Specific risk condition-asthma    Unable to reach pt. Left VM and MCM.  Rulon Abide, AGNP-C [phone] ([place])

## 2021-11-22 ENCOUNTER — Other Ambulatory Visit: Payer: Self-pay

## 2021-11-22 ENCOUNTER — Emergency Department (HOSPITAL_BASED_OUTPATIENT_CLINIC_OR_DEPARTMENT_OTHER)
Admission: EM | Admit: 2021-11-22 | Discharge: 2021-11-22 | Disposition: A | Discharge status: Home / Self Care | Payer: BC Managed Care – PPO | Attending: Emergency Medicine | Admitting: Emergency Medicine

## 2021-11-22 ENCOUNTER — Encounter (HOSPITAL_BASED_OUTPATIENT_CLINIC_OR_DEPARTMENT_OTHER): Payer: Self-pay | Admitting: Emergency Medicine

## 2021-11-22 DIAGNOSIS — J029 Acute pharyngitis, unspecified: Secondary | ICD-10-CM | POA: Diagnosis present

## 2021-11-22 DIAGNOSIS — E119 Type 2 diabetes mellitus without complications: Secondary | ICD-10-CM | POA: Diagnosis not present

## 2021-11-22 DIAGNOSIS — H9202 Otalgia, left ear: Secondary | ICD-10-CM | POA: Insufficient documentation

## 2021-11-22 DIAGNOSIS — J45909 Unspecified asthma, uncomplicated: Secondary | ICD-10-CM | POA: Diagnosis not present

## 2021-11-22 LAB — GROUP A STREP BY PCR: Group A Strep by PCR: NOT DETECTED

## 2021-11-22 MED ORDER — AMOXICILLIN 500 MG PO CAPS
500.0000 mg | ORAL_CAPSULE | Freq: Once | ORAL | Status: AC
  Administered 2021-11-22: 500 mg via ORAL
  Filled 2021-11-22: qty 1

## 2021-11-22 MED ORDER — AMOXICILLIN 500 MG PO CAPS: 500.0000 mg | ORAL_CAPSULE | Freq: Two times a day (BID) | ORAL | 0 refills | Status: AC

## 2021-11-22 NOTE — ED Triage Notes (Signed)
Pt reports sore throat, congestion, pressure in ears.  Denies cough. Afebrile  ?

## 2021-11-22 NOTE — Discharge Instructions (Signed)
You were seen in the emergency room today with sore throat and earache.  I am starting on antibiotics for your left ear.  Your strep test came back negative but the antibiotics that would treat strep also treat your ear infection.  Please continue Tylenol and ibuprofen over-the-counter for pain and drink plenty of cool fluids to stay hydrated.  Return with any new or suddenly worsening symptoms. ?

## 2021-11-22 NOTE — ED Provider Notes (Signed)
? ?Emergency Department Provider Note ? ? ?I have reviewed the triage vital signs and the nursing notes. ? ? ?HISTORY ? ?Chief Complaint ?Sore Throat ? ? ?HPI ?[name] is a 41 y.o. female presents to the ED with sore throat, congestion, and ear pressure. No cough or SOB. No CP. No fever. No sick contacts. Symptoms developing over the last 2 days. No abdominal pain.  ? ? ?Past Medical History:  ?Diagnosis Date  ? Allergy   ? Anemia   ? Asthma   ? as a child - no current med. or prob.  ? Diabetes mellitus without complication ([place])   ? Ganglion cyst of wrist 08/2011  ? left  ? HSV infection   ? PONV (postoperative nausea and vomiting)   ? Seasonal allergies   ? Spinal headache   ? ? ?Review of Systems ? ?Constitutional: No fever/chills ?Eyes: No visual changes. ?ENT: Positive sore throat and congestion.  ?Cardiovascular: Denies chest pain. ?Respiratory: Denies shortness of breath. ?Gastrointestinal: No abdominal pain.  No nausea, no vomiting.  No diarrhea.  No constipation. ? ?____________________________________________ ? ? ?PHYSICAL EXAM: ? ?VITAL SIGNS: ?ED Triage Vitals  ?Enc Vitals Group  ?   BP 11/22/21 2141 (!) 133/96  ?   Pulse Rate 11/22/21 2141 (!) 106  ?   Resp 11/22/21 2141 18  ?   Temp 11/22/21 2141 99.3 ?F (37.4 ?C)  ?   Temp Source 11/22/21 2141 Oral  ?   SpO2 11/22/21 2141 100 %  ? ?Constitutional: Alert and oriented. Well appearing and in no acute distress. ?Eyes: Conjunctivae are normal. Eras with fluid noted with some dullness and mild erythema.  ?Head: Atraumatic. ?Nose: No congestion/rhinnorhea. ?Mouth/Throat: Mucous membranes are moist.  Oropharynx with mild erythema. No PTA. No trismus. Normal voice. Managing oral secretions.  ?Neck: No stridor.  ?Cardiovascular: Good peripheral circulation.  ?Respiratory: Normal respiratory effort.   ?Gastrointestinal: No distention.  ?Musculoskeletal: No gross deformities of extremities. ?Neurologic:  Normal speech and language.  ?Skin:  Skin is warm,  dry and intact. No rash noted. ? ?____________________________________________ ?  ?LABS ?(all labs ordered are listed, but only abnormal results are displayed) ? ?Labs Reviewed  ?GROUP A STREP BY PCR  ? ?____________________________________________ ? ? ?PROCEDURES ? ?Procedure(s) performed:  ? ?Procedures ? ?None ?____________________________________________ ? ? ?INITIAL IMPRESSION / ASSESSMENT AND PLAN / ED COURSE ? ?Pertinent labs & imaging results that were available during my care of the patient were reviewed by me and considered in my medical decision making (see chart for details). ?  ?This patient is Presenting for Evaluation of sore throat, which does require a range of treatment options, and is a complaint that involves a high risk of morbidity and mortality. ? ?The Differential Diagnoses include strep throat, viral pharyngitis, PTA, retropharyngeal abscess.. ? ?Critical Interventions-  ?  ?Medications  ?amoxicillin (AMOXIL) capsule 500 mg (500 mg Oral Given 11/22/21 2250)  ? ? ?Reassessment after intervention:  symptoms unchanged ? ?  ?Clinical Laboratory Tests Ordered, included Strep PCR negative. ? ?Medical Decision Making: Summary:  ?Patient presents to the ED with sore throat and congestion. Question some OTM on exam. Strep negative but will cover with abx.  ? ?Reevaluation with update and discussion with patient. Discussed treatment plan and PCP follow up.  ? ?Disposition: discharge ? ?____________________________________________ ? ?FINAL CLINICAL IMPRESSION(S) / ED DIAGNOSES ? ?Final diagnoses:  ?Pharyngitis, unspecified etiology  ?Left ear pain  ? ? ? ?NEW OUTPATIENT MEDICATIONS STARTED DURING THIS VISIT: ? ?Discharge  Medication List as of 11/22/2021 10:42 PM  ?  ? ?START taking these medications  ? Details  ?amoxicillin (AMOXIL) 500 MG capsule Take 1 capsule (500 mg total) by mouth 2 (two) times daily for 7 days., Starting Thu 11/22/2021, Until Thu 11/29/2021, Print  ?  ?  ? ? ?Note:  This document was  prepared using Dragon voice recognition software and may include unintentional dictation errors. ? ?Nanda Quinton, MD, FACEP ?Emergency Medicine ? ?  ?Margette Fast, MD ?11/28/21 1210 ? ?

## 2021-11-29 ENCOUNTER — Encounter (HOSPITAL_BASED_OUTPATIENT_CLINIC_OR_DEPARTMENT_OTHER): Payer: Self-pay | Admitting: Urology

## 2021-11-29 ENCOUNTER — Emergency Department (HOSPITAL_BASED_OUTPATIENT_CLINIC_OR_DEPARTMENT_OTHER)
Admission: EM | Admit: 2021-11-29 | Discharge: 2021-11-29 | Disposition: A | Discharge status: Home / Self Care | Payer: BC Managed Care – PPO | Attending: Emergency Medicine | Admitting: Emergency Medicine

## 2021-11-29 ENCOUNTER — Emergency Department (HOSPITAL_BASED_OUTPATIENT_CLINIC_OR_DEPARTMENT_OTHER): Payer: BC Managed Care – PPO

## 2021-11-29 DIAGNOSIS — R051 Acute cough: Secondary | ICD-10-CM | POA: Diagnosis not present

## 2021-11-29 DIAGNOSIS — R0602 Shortness of breath: Secondary | ICD-10-CM | POA: Diagnosis present

## 2021-11-29 MED ORDER — DEXAMETHASONE 4 MG PO TABS
10.0000 mg | ORAL_TABLET | Freq: Once | ORAL | Status: AC
  Administered 2021-11-29: 10 mg via ORAL
  Filled 2021-11-29: qty 3

## 2021-11-29 NOTE — Discharge Instructions (Signed)
Please return if you have worsening shortness of breath as discussed.  I am treating you for inflammation of your lungs with a long-acting steroid. ?

## 2021-11-29 NOTE — ED Triage Notes (Signed)
Pt states trouble taking deep breaths x 1 week, states coughing , no productive ?No h/o asthma ?NAD noted   ?

## 2021-11-29 NOTE — ED Provider Notes (Signed)
?[name] ?Provider Note ? ? ?CSN: [id number] ?Arrival date & time: 11/29/21  2039 ? ?  ? ?History ? ?Chief Complaint  ?Patient presents with  ? Shortness of Breath  ? ? ?[name] is a 41 y.o. female. ? ?Patient here with cough and shortness of breath.  For the last several days.  Denies any chest pain.  Feels like when she takes a deep breath she has to cough.  Denies any recent illnesses.  No recent surgery or travel.  No history of blood clots.  No history of significant cardiac disease.  Former asthmatic.  Does not smoke.  No symptoms at rest. ? ?The history is provided by the patient.  ?Shortness of Breath ?Severity:  Mild ?Timing:  Intermittent ?Progression:  Waxing and waning ?Chronicity:  New ?Context comment:  Cough and ?Relieved by:  Nothing ?Worsened by:  Nothing ?Associated symptoms: cough   ?Associated symptoms: no abdominal pain, no chest pain, no claudication, no diaphoresis, no ear pain, no fever, no headaches, no hemoptysis, no PND, no rash, no sore throat, no sputum production, no syncope, no swollen glands, no vomiting and no wheezing   ?Risk factors: no hx of PE/DVT, no oral contraceptive use, no prolonged immobilization and no recent surgery   ? ?  ? ?Home Medications ?Prior to Admission medications   ?Medication Sig Start Date End Date Taking? Authorizing Provider  ?acetaminophen (TYLENOL) 500 MG tablet Take 500 mg by mouth every 6 (six) hours as needed for mild pain.    [provider]  ?amoxicillin (AMOXIL) 500 MG capsule Take 1 capsule (500 mg total) by mouth 2 (two) times daily for 7 days. 11/22/21 11/29/21  Long, Wonda Olds, MD  ?ibuprofen (ADVIL,MOTRIN) 600 MG tablet Take 1 tablet (600 mg total) by mouth every 6 (six) hours as needed for mild pain or cramping. 09/16/15   Caren Macadam, MD  ?   ? ?Allergies    ?Patient has no known allergies.   ? ?Review of Systems   ?Review of Systems  ?Constitutional:  Negative for diaphoresis and fever.   ?HENT:  Negative for ear pain and sore throat.   ?Respiratory:  Positive for cough and shortness of breath. Negative for hemoptysis, sputum production and wheezing.   ?Cardiovascular:  Negative for chest pain, claudication, syncope and PND.  ?Gastrointestinal:  Negative for abdominal pain and vomiting.  ?Skin:  Negative for rash.  ?Neurological:  Negative for headaches.  ? ?Physical Exam ?Updated Vital Signs ?BP (!) 143/88 (BP Location: Left Arm)   Pulse 85   Temp 98.5 ?F (36.9 ?C) (Oral)   Resp 18   Ht '5\' 3"'$  (1.6 m)   Wt 59 kg   LMP 11/29/2021 (Exact Date)   SpO2 99%   BMI 23.03 kg/m?  ?Physical Exam ?Vitals and nursing note reviewed.  ?Constitutional:   ?   General: She is not in acute distress. ?   Appearance: She is well-developed.  ?HENT:  ?   Head: Normocephalic and atraumatic.  ?   Mouth/Throat:  ?   Mouth: Mucous membranes are moist.  ?Eyes:  ?   Conjunctiva/sclera: Conjunctivae normal.  ?   Pupils: Pupils are equal, round, and reactive to light.  ?Cardiovascular:  ?   Rate and Rhythm: Normal rate and regular rhythm.  ?   Heart sounds: No murmur heard. ?Pulmonary:  ?   Effort: Pulmonary effort is normal. No respiratory distress.  ?   Breath sounds: Normal breath sounds. No  decreased breath sounds or wheezing.  ?Abdominal:  ?   Palpations: Abdomen is soft.  ?   Tenderness: There is no abdominal tenderness.  ?Musculoskeletal:     ?   General: No swelling.  ?   Cervical back: Normal range of motion and neck supple.  ?Skin: ?   General: Skin is warm and dry.  ?   Capillary Refill: Capillary refill takes less than 2 seconds.  ?Neurological:  ?   Mental Status: She is alert.  ?Psychiatric:     ?   Mood and Affect: Mood normal.  ? ? ?ED Results / Procedures / Treatments   ?Labs ?(all labs ordered are listed, but only abnormal results are displayed) ?Labs Reviewed - No data to display ? ?EKG ?EKG Interpretation ? ?Date/Time:  Thursday November 29 2021 20:57:02 EDT ?Ventricular Rate:  75 ?PR Interval:  160 ?QRS  Duration: 68 ?QT Interval:  374 ?QTC Calculation: 417 ?R Axis:   70 ?Text Interpretation: Normal sinus rhythm Normal ECG When compared with ECG of 08-Dec-2016 14:58, PREVIOUS ECG IS PRESENT Confirmed by Lennice Sites [phone]) on 11/29/2021 8:59:30 PM ? ?Radiology ?DG Chest Portable 1 View ? ?Result Date: 11/29/2021 ?CLINICAL DATA:  Dyspnea EXAM: PORTABLE CHEST 1 VIEW COMPARISON:  12/08/2016 chest radiograph. FINDINGS: Stable cardiomediastinal silhouette with normal heart size. No pneumothorax. No pleural effusion. Lungs appear clear, with no acute consolidative airspace disease and no pulmonary edema. IMPRESSION: No active disease. Electronically Signed   By: Ilona Sorrel M.D.   On: 11/29/2021 20:59   ? ?Procedures ?Procedures  ? ? ?Medications Ordered in ED ?Medications  ?dexamethasone (DECADRON) tablet 10 mg (has no administration in time range)  ? ? ?ED Course/ Medical Decision Making/ A&P ?  ?                        ?Medical Decision Making ?Amount and/or Complexity of Data Reviewed ?Radiology: ordered. ? ?Risk ?Prescription drug management. ? ? ?[name] is here with cough and shortness of breath.  Symptoms for the last several days.  No chest pain, no infectious symptoms other than cough.  No sputum production.  Patient denies smoking history.  She is PERC negative.  No history of blood clots.  No tachycardia.  No recent surgery or travel.  She is not on estrogen.  She has clear breath sounds.  No signs of respiratory distress.  EKG per my review and interpretation shows sinus rhythm.  No ischemic changes.  Chest x-ray per my review and interpretation shows no pneumonia or pneumothorax.  Overall suspect viral/bronchitis type process.  We will treat her with Decadron.  At this time no further work-up for PE or ACS is needed per my history and physical.  She understands return if symptoms worsen.  Discharged in good condition. ? ?This chart was dictated using voice recognition software.  Despite best efforts  to proofread,  errors can occur which can change the documentation meaning.  ? ? ? ? ? ? ? ?Final Clinical Impression(s) / ED Diagnoses ?Final diagnoses:  ?SOB (shortness of breath)  ?Acute cough  ? ? ?Rx / DC Orders ?ED Discharge Orders   ? ? None  ? ?  ? ? ?  ?Lennice Sites, DO ?11/29/21 2257 ? ?

## 2022-05-02 ENCOUNTER — Other Ambulatory Visit: Payer: Self-pay | Admitting: Obstetrics and Gynecology

## 2022-05-02 ENCOUNTER — Non-Acute Institutional Stay (HOSPITAL_COMMUNITY)
Admission: RE | Admit: 2022-05-02 | Discharge: 2022-05-02 | Disposition: A | Discharge status: Home / Self Care | Payer: BC Managed Care – PPO | Source: Ambulatory Visit | Attending: Internal Medicine | Admitting: Internal Medicine

## 2022-05-02 DIAGNOSIS — D649 Anemia, unspecified: Secondary | ICD-10-CM | POA: Diagnosis present

## 2022-05-02 DIAGNOSIS — R928 Other abnormal and inconclusive findings on diagnostic imaging of breast: Secondary | ICD-10-CM

## 2022-05-02 MED ORDER — SODIUM CHLORIDE 0.9 % IV SOLN
200.0000 mg | Freq: Once | INTRAVENOUS | Status: AC
  Administered 2022-05-02: 200 mg via INTRAVENOUS
  Filled 2022-05-02: qty 10

## 2022-05-02 MED ORDER — SODIUM CHLORIDE 0.9 % IV SOLN: INTRAVENOUS | Status: DC | PRN

## 2022-05-02 NOTE — Progress Notes (Signed)
PATIENT CARE CENTER NOTE   Diagnosis: Anemia, unspecified D64.9   Provider: Eula Flax, MD    Procedure: Venofer infusion    Note:  Patient received 200 mg Venofer infusion (dose # 1 of 2) via PIV. No pre-medications ordered. Patient tolerated infusion well with no adverse reaction. Observed patient for 30 minutes post infusion. Vital signs stable. Discharge instructions given. Patient to come back in 1 week for second infusion and will schedule next appointment at the front desk. Patient alert, oriented and ambulatory at discharge.

## 2022-05-09 ENCOUNTER — Non-Acute Institutional Stay (HOSPITAL_COMMUNITY)
Admission: RE | Admit: 2022-05-09 | Discharge: 2022-05-09 | Disposition: A | Discharge status: Home / Self Care | Payer: BC Managed Care – PPO | Source: Ambulatory Visit | Attending: Internal Medicine | Admitting: Internal Medicine

## 2022-05-09 DIAGNOSIS — D649 Anemia, unspecified: Secondary | ICD-10-CM | POA: Diagnosis present

## 2022-05-09 MED ORDER — SODIUM CHLORIDE 0.9 % IV SOLN: INTRAVENOUS | Status: DC | PRN

## 2022-05-09 MED ORDER — SODIUM CHLORIDE 0.9 % IV SOLN
200.0000 mg | Freq: Once | INTRAVENOUS | Status: AC
  Administered 2022-05-09: 200 mg via INTRAVENOUS
  Filled 2022-05-09: qty 10

## 2022-05-09 NOTE — Progress Notes (Signed)
PATIENT CARE CENTER NOTE     Diagnosis: Anemia, unspecified D64.9     Provider: Eula Flax, MD     Procedure: Venofer infusion      Note:  Patient received 200 mg Venofer infusion (dose # 2 of 2) via PIV. No pre-medications ordered. Patient tolerated infusion well with no adverse reaction. Observed patient for 30 minutes post infusion. Vital signs stable. AVS offered but patient refused. Patient alert, oriented and ambulatory at discharge.

## 2022-05-22 ENCOUNTER — Ambulatory Visit
Admission: RE | Admit: 2022-05-22 | Discharge: 2022-05-22 | Disposition: A | Discharge status: Home / Self Care | Payer: BC Managed Care – PPO | Source: Ambulatory Visit | Attending: Obstetrics and Gynecology | Admitting: Obstetrics and Gynecology

## 2022-05-22 DIAGNOSIS — R928 Other abnormal and inconclusive findings on diagnostic imaging of breast: Secondary | ICD-10-CM

## 2022-09-13 IMAGING — DX DG CHEST 1V PORT
1 series · 1 of 1 positions shown · non-contrast
Comparison: 12/08/2016 chest radiograph.

CLINICAL DATA: Dyspnea

EXAM:
PORTABLE CHEST 1 VIEW

[chest ap]
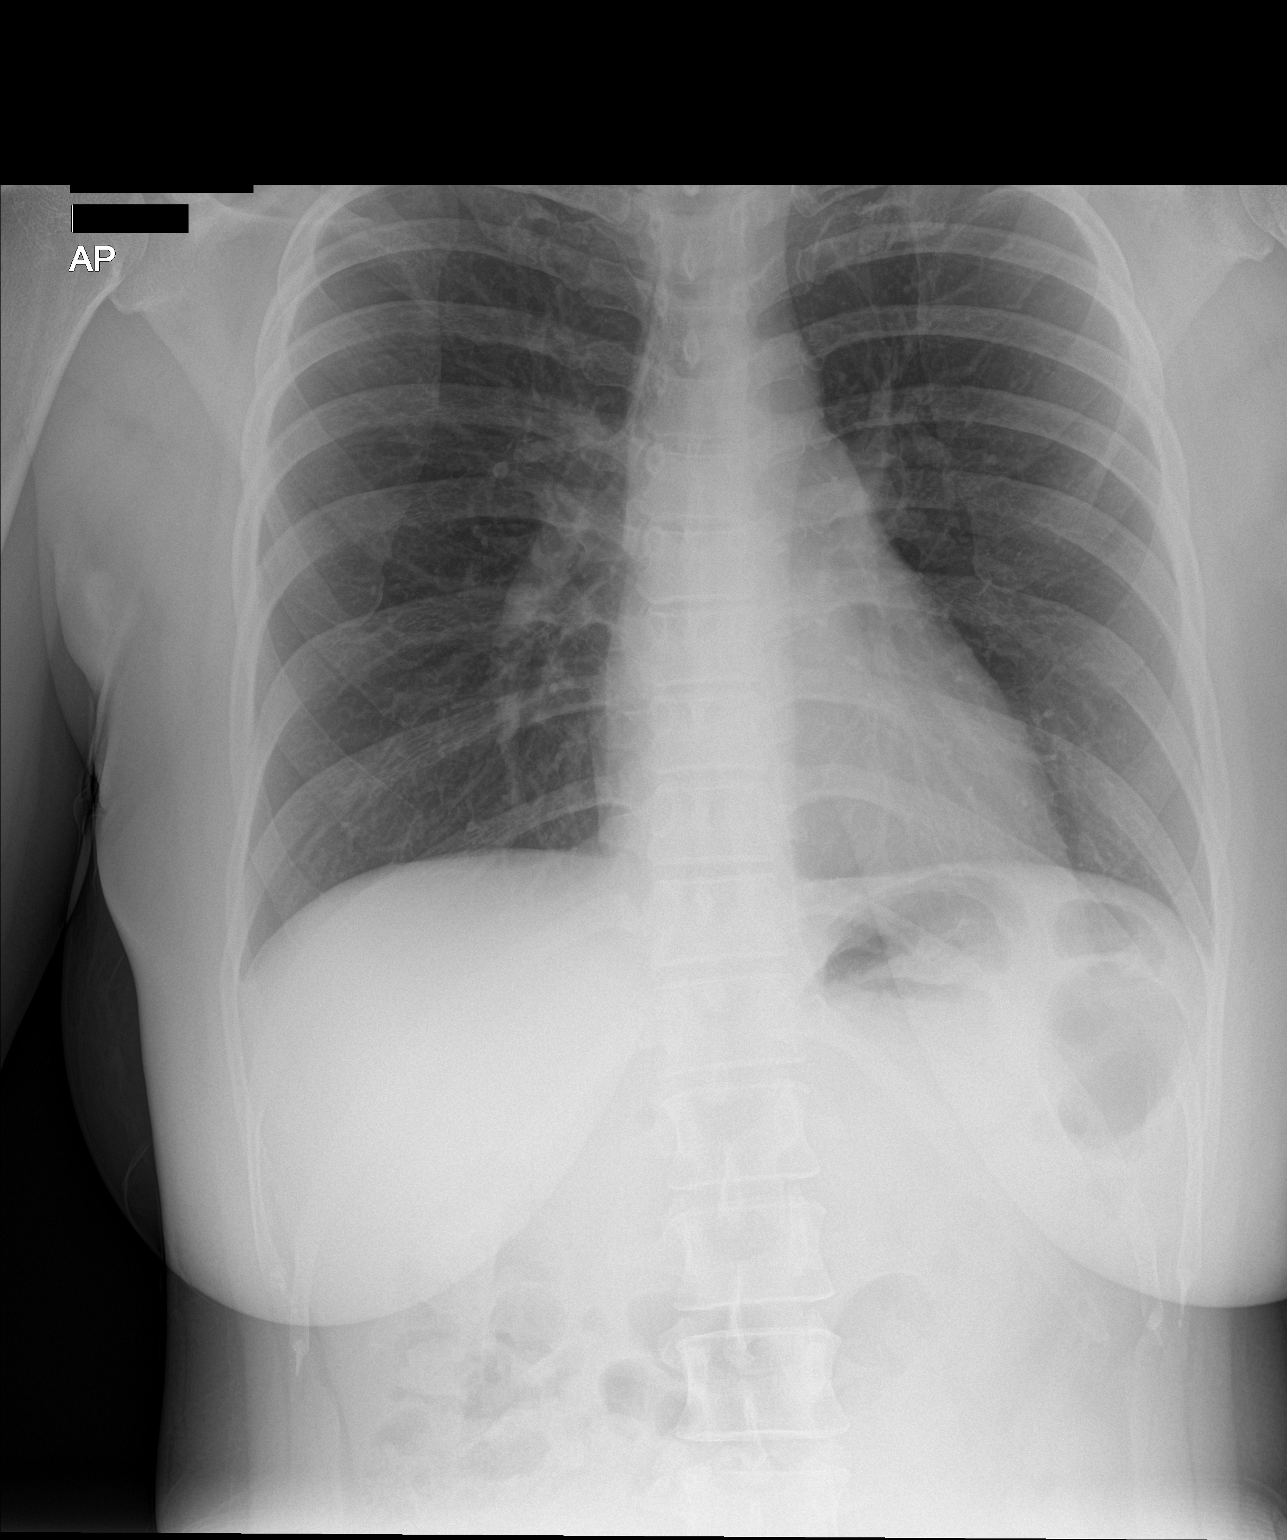

[1 of 1 positions shown; findings below may reference images not displayed]

FINDINGS: Stable cardiomediastinal silhouette with normal heart size. No
pneumothorax. No pleural effusion. Lungs appear clear, with no acute
consolidative airspace disease and no pulmonary edema.
IMPRESSION: No active disease.

## 2023-03-04 ENCOUNTER — Other Ambulatory Visit: Payer: Self-pay

## 2023-03-04 ENCOUNTER — Emergency Department (HOSPITAL_BASED_OUTPATIENT_CLINIC_OR_DEPARTMENT_OTHER)
Admission: EM | Admit: 2023-03-04 | Discharge: 2023-03-04 | Disposition: A | Discharge status: Home / Self Care | Payer: BC Managed Care – PPO | Attending: Emergency Medicine | Admitting: Emergency Medicine

## 2023-03-04 ENCOUNTER — Encounter (HOSPITAL_BASED_OUTPATIENT_CLINIC_OR_DEPARTMENT_OTHER): Payer: Self-pay | Admitting: Emergency Medicine

## 2023-03-04 DIAGNOSIS — M79622 Pain in left upper arm: Secondary | ICD-10-CM | POA: Insufficient documentation

## 2023-03-04 DIAGNOSIS — R202 Paresthesia of skin: Secondary | ICD-10-CM | POA: Insufficient documentation

## 2023-03-04 DIAGNOSIS — M79601 Pain in right arm: Secondary | ICD-10-CM

## 2023-03-04 DIAGNOSIS — M79621 Pain in right upper arm: Secondary | ICD-10-CM | POA: Insufficient documentation

## 2023-03-04 MED ORDER — PREDNISONE 50 MG PO TABS
60.0000 mg | ORAL_TABLET | Freq: Once | ORAL | Status: AC
  Administered 2023-03-04: 60 mg via ORAL
  Filled 2023-03-04: qty 1

## 2023-03-04 MED ORDER — METHYLPREDNISOLONE 4 MG PO TBPK: ORAL_TABLET | ORAL | 0 refills | Status: AC

## 2023-03-04 NOTE — ED Notes (Signed)
Pt reports numbness and tingling in both arms x 2 days

## 2023-03-04 NOTE — ED Provider Notes (Signed)
[place] EMERGENCY DEPARTMENT AT [facility] [place] Provider Note   CSN: [id number] Arrival date & time: 03/04/23  2157     History  No chief complaint on file.   [name] is a 42 y.o. female.  42 yo F with a chief complaints of bilateral arm pain and tingling.  This has been going on for a few days.  Seems to be worse first thing in the morning when she wakes up and she has been having issues raising both of her arms.  She denies any neck pain denies head injury or neck injury.  She feels like it gets worse when she flexes her shoulder up to about 90 degrees.  Denies any significant change to her life.  No significant change to her activity.  Denies any recent medication changes.        Home Medications Prior to Admission medications   Medication Sig Start Date End Date Taking? Authorizing Provider  methylPREDNISolone (MEDROL DOSEPAK) 4 MG TBPK tablet Day 1: 8mg  before breakfast, 4 mg after lunch, 4 mg after supper, and 8 mg at bedtime Day 2: 4 mg before breakfast, 4 mg after lunch, 4 mg  after supper, and 8 mg  at bedtime Day 3:  4 mg  before breakfast, 4 mg  after lunch, 4 mg after supper, and 4 mg  at bedtime Day 4: 4 mg  before breakfast, 4 mg  after lunch, and 4 mg at bedtime Day 5: 4 mg  before breakfast and 4 mg at bedtime Day 6: 4 mg  before breakfast 03/04/23  Yes Melene Plan, DO  acetaminophen (TYLENOL) 500 MG tablet Take 500 mg by mouth every 6 (six) hours as needed for mild pain.    [provider]  ibuprofen (ADVIL,MOTRIN) 600 MG tablet Take 1 tablet (600 mg total) by mouth every 6 (six) hours as needed for mild pain or cramping. 09/16/15   Federico Flake, MD      Allergies    Patient has no known allergies.    Review of Systems   Review of Systems  Physical Exam Updated Vital Signs BP 119/77 (BP Location: Right Arm)   Pulse 94   Temp 98.5 F (36.9 C) (Oral)   Resp 18   Ht 5\' 3"  (1.6 m)   Wt 59 kg   LMP 02/18/2023 (Approximate)    SpO2 100%   BMI 23.03 kg/m  Physical Exam Vitals and nursing note reviewed.  Constitutional:      General: She is not in acute distress.    Appearance: She is well-developed. She is not diaphoretic.  HENT:     Head: Normocephalic and atraumatic.  Eyes:     Pupils: Pupils are equal, round, and reactive to light.  Cardiovascular:     Rate and Rhythm: Normal rate and regular rhythm.     Heart sounds: No murmur heard.    No friction rub. No gallop.  Pulmonary:     Effort: Pulmonary effort is normal.     Breath sounds: No wheezing or rales.  Abdominal:     General: There is no distension.     Palpations: Abdomen is soft.     Tenderness: There is no abdominal tenderness.  Musculoskeletal:        General: No tenderness.     Cervical back: Normal range of motion and neck supple.     Comments: Pulse motor and sensation intact in bilateral upper extremities.  No appreciable weakness.  No clonus.  No obvious midline C-spine tenderness.  Able to rotate her head 45 degrees direction without pain.  She has some mild tightness to bilateral trapezius muscles but denies any discomfort.  She has a negative Spurling's test bilaterally.  I am able to passively range her arms and both at about 90 degrees of forward flexion have some mild worsening discomfort.  Skin:    General: Skin is warm and dry.  Neurological:     Mental Status: She is alert and oriented to person, place, and time.  Psychiatric:        Behavior: Behavior normal.     ED Results / Procedures / Treatments   Labs (all labs ordered are listed, but only abnormal results are displayed) Labs Reviewed - No data to display  EKG EKG Interpretation Date/Time:  Tuesday March 04 2023 22:06:46 EDT Ventricular Rate:  70 PR Interval:  173 QRS Duration:  76 QT Interval:  368 QTC Calculation: 397 R Axis:   71  Text Interpretation: Sinus rhythm No significant change since last tracing Confirmed by Melene Plan [phone]) on 03/04/2023 10:16:18  PM  Radiology No results found.  Procedures Procedures    Medications Ordered in ED Medications  predniSONE (DELTASONE) tablet 60 mg (60 mg Oral Given 03/04/23 2235)    ED Course/ Medical Decision Making/ A&P                             Medical Decision Making Risk Prescription drug management.   42 yo F with a chief complaints of what sounds like paresthesias to bilateral upper extremities.  She tells me that is from the shoulder down bilaterally.  Circumferential.  No neck pain no trauma.  Has a benign exam here.  I am unsure how to explain the bilateral nature of her symptoms.  She is otherwise well-appearing and nontoxic.  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  Will trial a Medrol Dosepak.  Will have her follow-up with neurology as an outpatient.  Encouraged her to follow-up with her family doctor as well.  10:45 PM:  I have discussed the diagnosis/risks/treatment options with the patient and family.  Evaluation and diagnostic testing in the emergency department does not suggest an emergent condition requiring admission or immediate intervention beyond what has been performed at this time.  They will follow up with PCP, Neuro. We also discussed returning to the ED immediately if new or worsening sx occur. We discussed the sx which are most concerning (e.g., sudden worsening pain, fever, inability to tolerate by mouth, one sided symptoms, worsening weakness, neck pain, headache) that necessitate immediate return. Medications administered to the patient during their visit and any new prescriptions provided to the patient are listed below.  Medications given during this visit Medications  predniSONE (DELTASONE) tablet 60 mg (60 mg Oral Given 03/04/23 2235)     The patient appears reasonably screen and/or stabilized for discharge and I doubt any other medical condition or other [facility] requiring further screening, evaluation, or treatment in the  ED at this time prior to discharge.          Final Clinical Impression(s) / ED Diagnoses Final diagnoses:  Paresthesia and pain of both upper extremities    Rx / DC Orders ED Discharge Orders          Ordered    Ambulatory referral to Neurology       Comments:  Paresthesias   03/04/23 2228    methylPREDNISolone (MEDROL DOSEPAK) 4 MG TBPK tablet        03/04/23 2229              Melene Plan, DO 03/04/23 2245

## 2023-03-04 NOTE — ED Triage Notes (Signed)
Numbness and tingling from shoulders to fingertips X 2 days, is worse in the mornings.

## 2023-03-04 NOTE — Discharge Instructions (Signed)
Follow up with the neurologist in the office.  Follow up with your family doc in the office. Return for worsening symptoms.

## 2023-03-06 ENCOUNTER — Ambulatory Visit: Payer: BC Managed Care – PPO | Admitting: Neurology

## 2023-03-06 ENCOUNTER — Encounter: Payer: Self-pay | Admitting: Neurology

## 2023-03-06 VITALS — BP 133/79 | HR 73 | Ht 63.0 in | Wt 147.8 lb

## 2023-03-06 DIAGNOSIS — M25512 Pain in left shoulder: Secondary | ICD-10-CM

## 2023-03-06 DIAGNOSIS — M25511 Pain in right shoulder: Secondary | ICD-10-CM

## 2023-03-06 DIAGNOSIS — R200 Anesthesia of skin: Secondary | ICD-10-CM

## 2023-03-06 DIAGNOSIS — M5412 Radiculopathy, cervical region: Secondary | ICD-10-CM | POA: Diagnosis not present

## 2023-03-06 NOTE — Progress Notes (Signed)
[facility]  PATIENT: [name] DOB: 17-Aug-1981  REQUESTING CLINICIAN: Melene Plan, DO HISTORY FROM: Patient  REASON FOR VISIT: Bilateral arm pain and numbness    HISTORICAL  CHIEF COMPLAINT:  Chief Complaint  Patient presents with   New Patient (Initial Visit)    Rm16, alone, Paresthesias:bilateral shoulders radiates down through arms into fingers (ongoing since 03/02/23)     HISTORY OF PRESENT ILLNESS:  This is a 42 year old woman with no past medical history history who is presenting with new complaint of bilateral shoulder, arm pain since Sunday.  Patient reports she woke up with pain in both shoulders radiating down to her fingers.  There is also associated weakness, numbness and tingling.  Described the pain as throbbing and sometimes electric shock starting from both shoulders and traveling down the arms.  She denies any injury to her neck or to both shoulders.  Denies lifting heavy weights.  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  She has not started any new medication.  She reports during this time she has not tried any medication including Advil.  She was seen in the hospital 2 days ago given a Medrol Dosepak but she has not started it.   OTHER MEDICAL CONDITIONS: None reported    REVIEW OF SYSTEMS: Full 14 system review of systems performed and negative with exception of: As noted in the HPI   ALLERGIES: No Known Allergies  HOME MEDICATIONS: Outpatient Medications Prior to Visit  Medication Sig Dispense Refill   acetaminophen (TYLENOL) 500 MG tablet Take 500 mg by mouth every 6 (six) hours as needed for mild pain.     ibuprofen (ADVIL,MOTRIN) 600 MG tablet Take 1 tablet (600 mg total) by mouth every 6 (six) hours as needed for mild pain or cramping. 90 tablet 1   methylPREDNISolone (MEDROL DOSEPAK) 4 MG TBPK tablet Day 1: 8mg  before breakfast, 4 mg after lunch, 4 mg after supper, and 8 mg at bedtime Day  2: 4 mg before breakfast, 4 mg after lunch, 4 mg  after supper, and 8 mg  at bedtime Day 3:  4 mg  before breakfast, 4 mg  after lunch, 4 mg after supper, and 4 mg  at bedtime Day 4: 4 mg  before breakfast, 4 mg  after lunch, and 4 mg at bedtime Day 5: 4 mg  before breakfast and 4 mg at bedtime Day 6: 4 mg  before breakfast 1 each 0   No facility-administered medications prior to visit.    PAST MEDICAL HISTORY: Past Medical History:  Diagnosis Date   Allergy    Anemia    Asthma    as a child - no current med. or prob.   Diabetes mellitus without complication (HCC)    Ganglion cyst of wrist 08/2011   left   HSV infection    PONV (postoperative nausea and vomiting)    Seasonal allergies    Spinal headache     PAST SURGICAL HISTORY: Past Surgical History:  Procedure Laterality Date   CESAREAN SECTION  05/14/2005; 11/19/2007   CESAREAN SECTION WITH BILATERAL TUBAL LIGATION Bilateral 05/31/2015   Procedure: CESAREAN SECTION WITH BILATERAL TUBAL LIGATION;  Surgeon: Tracey Harries, MD;  Location: [facility];  Service: Obstetrics;  Laterality: Bilateral;   GANGLION CYST EXCISION  10/01/2011   Procedure: REMOVAL GANGLION OF WRIST;  Surgeon: Wyn Forster., MD;  Location: [facility];  Service: Orthopedics;  Laterality: Left;  left  wrist    FAMILY HISTORY: Family History  Problem Relation Age of Onset   Hypertension Mother    Colon cancer Neg Hx    Rectal cancer Neg Hx    Stomach cancer Neg Hx    Esophageal cancer Neg Hx     SOCIAL HISTORY: Social History   Socioeconomic History   Marital status: Legally Separated    Spouse name: chris   Number of children: 4   Years of education: Not on file   Highest education level: Bachelor's degree (e.g., BA, AB, BS)  Occupational History   Not on file  Tobacco Use   Smoking status: Never   Smokeless tobacco: Never  Vaping Use   Vaping status: Never Used  Substance and Sexual Activity   Alcohol use: Yes    Alcohol/week:  1.0 standard drink of alcohol    Types: 1 Standard drinks or equivalent per week    Comment: occasionally   Drug use: No   Sexual activity: Yes    Birth control/protection: None  Other Topics Concern   Not on file  Social History Narrative   Not on file   Social Determinants of Health   Financial Resource Strain: Not on file  Food Insecurity: Not on file  Transportation Needs: Not on file  Physical Activity: Not on file  Stress: Not on file  Social Connections: Not on file  Intimate Partner Violence: Not on file    PHYSICAL EXAM  GENERAL EXAM/CONSTITUTIONAL: Vitals:  Vitals:   03/06/23 1508  BP: 133/79  Pulse: 73  Weight: 147 lb 12.8 oz (67 kg)  Height: 5\' 3"  (1.6 m)   Body mass index is 26.18 kg/m. Wt Readings from Last 3 Encounters:  03/06/23 147 lb 12.8 oz (67 kg)  03/04/23 130 lb (59 kg)  11/29/21 130 lb (59 kg)   Patient is in no distress; well developed, nourished and groomed; neck is supple  MUSCULOSKELETAL: Gait, strength, tone, movements noted in Neurologic exam below  NEUROLOGIC: MENTAL STATUS:      No data to display         awake, alert, oriented to person, place and time recent and remote memory intact normal attention and concentration language fluent, comprehension intact, naming intact fund of knowledge appropriate  CRANIAL NERVE:  2nd, 3rd, 4th, 6th - Visual fields full to confrontation, extraocular muscles intact, no nystagmus 5th - facial sensation symmetric 7th - facial strength symmetric 8th - hearing intact 9th - palate elevates symmetrically, uvula midline 11th - shoulder shrug symmetric 12th - tongue protrusion midline  MOTOR:  normal bulk and tone, full strength in the BUE, BLE  SENSORY:  normal and symmetric to light touch  COORDINATION:  finger-nose-finger, fine finger movements normal  REFLEXES:  deep tendon reflexes present and symmetric  GAIT/STATION:  normal   DIAGNOSTIC DATA (LABS, IMAGING, TESTING) -  I reviewed patient records, labs, notes, testing and imaging myself where available.  Lab Results  Component Value Date   WBC 5.1 12/08/2016   HGB 10.6 (L) 12/08/2016   HCT 32.8 (L) 12/08/2016   MCV 83.0 12/08/2016   PLT 235 12/08/2016      Component Value Date/Time   NA 136 12/08/2016 1520   K 3.6 12/08/2016 1520   CL 107 12/08/2016 1520   CO2 24 12/08/2016 1520   GLUCOSE 95 12/08/2016 1520   BUN 10 12/08/2016 1520   CREATININE 0.69 12/08/2016 1520   CALCIUM 9.0 12/08/2016 1520   PROT 7.0 06/05/2015 2057  ALBUMIN 2.9 (L) 06/05/2015 2057   AST 27 06/05/2015 2057   ALT 26 06/05/2015 2057   ALKPHOS 190 (H) 06/05/2015 2057   BILITOT 0.4 06/05/2015 2057   GFRNONAA >60 12/08/2016 1520   GFRAA >60 12/08/2016 1520   No results found for: "CHOL", "HDL", "LDLCALC", "LDLDIRECT", "TRIG", "CHOLHDL" No results found for: "HGBA1C" No results found for: "VITAMINB12" No results found for: "TSH"  MRI/MRV 2016 Normal MRI/ MRV of the brain.     ASSESSMENT AND PLAN  42 y.o. year old female with medical history who is presenting with new onset bilateral shoulders pain traveling down both arms with numbness and tingling.  Denies any risk factors or exacerbating factors.  Will obtain ESR, CRP and CK to rule out inflammation, myopathy and also obtain a cervical spine MRI to rule out cervical radiculopathy/myelopathy due to complaint of shooting pain down both arms.  I will contact the patient to go over the results.  I also did advise her to try Advil for.  Return sooner if worse.    1. Numbness   2. Acute pain of both shoulders   3. Cervical radiculopathy      Patient Instructions  ESR, CRP, CK to rule out for myopathy Cervical spine MRI to rule out radiculopathy/myelopathy Trial of Advil as needed for pain Follow-up with your PCP and return as needed.  Orders Placed This Encounter  Procedures   MR CERVICAL SPINE WO CONTRAST   Sedimentation Rate   C-reactive Protein   CK     No orders of the defined types were placed in this encounter.   Return if symptoms worsen or fail to improve.    Windell Norfolk, MD 03/06/2023, 3:54 PM  [facility] [address] [place], [place] [address] [phone]

## 2023-03-06 NOTE — Patient Instructions (Signed)
ESR, CRP, CK to rule out for myopathy Cervical spine MRI to rule out radiculopathy/myelopathy Trial of Advil as needed for pain Follow-up with your PCP and return as needed.

## 2023-03-07 LAB — SEDIMENTATION RATE: Sed Rate: 5 mm/hr (ref 0–32)

## 2023-03-07 LAB — CK: Total CK: 92 U/L (ref 32–182)

## 2023-03-07 LAB — C-REACTIVE PROTEIN: CRP: 1 mg/L (ref 0–10)

## 2023-03-11 ENCOUNTER — Telehealth: Payer: Self-pay | Admitting: Neurology

## 2023-03-11 NOTE — Telephone Encounter (Signed)
Pt scheduled for 30 mins MR cervical spine wo contrast at [facility] for 03/12/23 at 3pm  [employer] auth# [id number] (03/10/23-04/08/23)

## 2023-03-12 ENCOUNTER — Ambulatory Visit: Payer: BC Managed Care – PPO

## 2023-03-12 DIAGNOSIS — M5412 Radiculopathy, cervical region: Secondary | ICD-10-CM | POA: Diagnosis not present

## 2023-04-10 ENCOUNTER — Other Ambulatory Visit: Payer: Self-pay

## 2023-04-10 DIAGNOSIS — R519 Headache, unspecified: Secondary | ICD-10-CM | POA: Diagnosis present

## 2023-04-10 DIAGNOSIS — G43909 Migraine, unspecified, not intractable, without status migrainosus: Secondary | ICD-10-CM | POA: Insufficient documentation

## 2023-04-10 DIAGNOSIS — J45909 Unspecified asthma, uncomplicated: Secondary | ICD-10-CM | POA: Diagnosis not present

## 2023-04-10 DIAGNOSIS — E119 Type 2 diabetes mellitus without complications: Secondary | ICD-10-CM | POA: Diagnosis not present

## 2023-04-11 ENCOUNTER — Emergency Department (HOSPITAL_BASED_OUTPATIENT_CLINIC_OR_DEPARTMENT_OTHER)
Admission: EM | Admit: 2023-04-11 | Discharge: 2023-04-11 | Disposition: A | Discharge status: Home / Self Care | Payer: BC Managed Care – PPO | Attending: Emergency Medicine | Admitting: Emergency Medicine

## 2023-04-11 ENCOUNTER — Encounter (HOSPITAL_BASED_OUTPATIENT_CLINIC_OR_DEPARTMENT_OTHER): Payer: Self-pay

## 2023-04-11 DIAGNOSIS — G43909 Migraine, unspecified, not intractable, without status migrainosus: Secondary | ICD-10-CM

## 2023-04-11 MED ORDER — NAPROXEN 250 MG PO TABS
500.0000 mg | ORAL_TABLET | Freq: Once | ORAL | Status: AC
  Administered 2023-04-11: 500 mg via ORAL
  Filled 2023-04-11: qty 2

## 2023-04-11 MED ORDER — NAPROXEN 500 MG PO TABS: 500.0000 mg | ORAL_TABLET | Freq: Two times a day (BID) | ORAL | 0 refills | Status: AC

## 2023-04-11 MED ORDER — PROCHLORPERAZINE MALEATE 10 MG PO TABS
10.0000 mg | ORAL_TABLET | Freq: Once | ORAL | Status: AC
  Administered 2023-04-11: 10 mg via ORAL
  Filled 2023-04-11: qty 1

## 2023-04-11 MED ORDER — DIPHENHYDRAMINE HCL 25 MG PO CAPS
50.0000 mg | ORAL_CAPSULE | Freq: Once | ORAL | Status: AC
  Administered 2023-04-11: 50 mg via ORAL
  Filled 2023-04-11: qty 2

## 2023-04-11 NOTE — ED Triage Notes (Signed)
Pt describes having a migraine since yesterday afternoon.  +nausea Took tylenol last night which was ineffective

## 2023-04-11 NOTE — ED Provider Notes (Signed)
[facility] Emergency Department Provider Note MRN:  [id number]  Arrival date & time: 04/11/23     Chief Complaint   Migraine   History of Present Illness   [name] is a 42 y.o. year-old female with a history of diabetes presenting to the ED with chief complaint of migraine.  Increased frequency of headaches over the past several months.  Used to have migraines when she was younger, thinks they are coming back.  Still global gradual onset headache starting several hours ago.  No numbness or weakness to the arms or legs, no fever.  Worse with bright lights and loud noises.  Review of Systems  A thorough review of systems was obtained and all systems are negative except as noted in the HPI and PMH.   Patient's Health History    Past Medical History:  Diagnosis Date   Allergy    Anemia    Asthma    as a child - no current med. or prob.   Diabetes mellitus without complication (HCC)    Ganglion cyst of wrist 08/2011   left   HSV infection    PONV (postoperative nausea and vomiting)    Seasonal allergies    Spinal headache     Past Surgical History:  Procedure Laterality Date   CESAREAN SECTION  05/14/2005; 11/19/2007   CESAREAN SECTION WITH BILATERAL TUBAL LIGATION Bilateral 05/31/2015   Procedure: CESAREAN SECTION WITH BILATERAL TUBAL LIGATION;  Surgeon: Tracey Harries, MD;  Location: [facility];  Service: Obstetrics;  Laterality: Bilateral;   GANGLION CYST EXCISION  10/01/2011   Procedure: REMOVAL GANGLION OF WRIST;  Surgeon: Wyn Forster., MD;  [facility];  Service: Orthopedics;  Laterality: Left;  left wrist    Family History  Problem Relation Age of Onset   Hypertension Mother    Colon cancer Neg Hx    Rectal cancer Neg Hx    Stomach cancer Neg Hx    Esophageal cancer Neg Hx     Social History   Socioeconomic History   Marital status: Legally Separated    Spouse name: chris   Number of children: 4    Years of education: Not on file   Highest education level: Bachelor's degree (e.g., BA, AB, BS)  Occupational History   Not on file  Tobacco Use   Smoking status: Never   Smokeless tobacco: Never  Vaping Use   Vaping status: Never Used  Substance and Sexual Activity   Alcohol use: Yes    Alcohol/week: 1.0 standard drink of alcohol    Types: 1 Standard drinks or equivalent per week    Comment: occasionally   Drug use: No   Sexual activity: Yes    Birth control/protection: None  Other Topics Concern   Not on file  Social History Narrative   Not on file   Social Determinants of Health   Financial Resource Strain: Not on file  Food Insecurity: Not on file  Transportation Needs: Not on file  Physical Activity: Not on file  Stress: Not on file  Social Connections: Not on file  Intimate Partner Violence: Not on file     Physical Exam   Vitals:   04/11/23 0022  BP: 130/80  Pulse: 75  Resp: 18  Temp: 98 F (36.7 C)  SpO2: 99%    CONSTITUTIONAL: Well-appearing, NAD NEURO/PSYCH:  Alert and oriented x 3, no focal deficits EYES:  eyes equal and reactive ENT/NECK:  no LAD, no JVD  CARDIO: Regular rate, well-perfused, normal S1 and S2 PULM:  CTAB no wheezing or rhonchi GI/GU:  non-distended, non-tender MSK/SPINE:  No gross deformities, no edema SKIN:  no rash, atraumatic   *Additional and/or pertinent findings included in MDM below  Diagnostic and Interventional Summary    EKG Interpretation Date/Time:    Ventricular Rate:    PR Interval:    QRS Duration:    QT Interval:    QTC Calculation:   R Axis:      Text Interpretation:         Labs Reviewed - No data to display  No orders to display    Medications  naproxen (NAPROSYN) tablet 500 mg (has no administration in time range)  prochlorperazine (COMPAZINE) tablet 10 mg (has no administration in time range)  diphenhydrAMINE (BENADRYL) capsule 50 mg (has no administration in time range)     Procedures  /   Critical Care Procedures  ED Course and Medical Decision Making  Initial Impression and Ddx Seems consistent with migraine which has had in the past.  No red flag symptoms to suggest subarachnoid hemorrhage or meningitis or any other emergent CNS process.  No indication for imaging at this time.  Past medical/surgical history that increases complexity of ED encounter: Migraines  Interpretation of Diagnostics Laboratory and/or imaging options to aid in the diagnosis/care of the patient were considered.  After careful history and physical examination, it was determined that there was no indication for diagnostics at this time.  Patient Reassessment and Ultimate Disposition/Management     Patient feels comfortable with oral medications to treat the pain, would like to go home, would like to follow-up with an headache expert.  Discharge.  Patient management required discussion with the following services or consulting groups:  None  Complexity of Problems Addressed Acute illness or injury that poses threat of life of bodily function  Additional Data Reviewed and Analyzed Further history obtained from: None  Additional Factors Impacting ED Encounter Risk Prescriptions  Elmer Sow. Pilar Plate, MD [facility] Emergency Medicine [facility] [email or web address]  Final Clinical Impressions(s) / ED Diagnoses     ICD-10-CM   1. Migraine without status migrainosus, not intractable, unspecified migraine type  G43.909       ED Discharge Orders          Ordered    naproxen (NAPROSYN) 500 MG tablet  2 times daily        04/11/23 9485             Discharge Instructions Discussed with and Provided to Patient:    Discharge Instructions      You were evaluated in the Emergency Department and after careful evaluation, we did not find any emergent condition requiring admission or further testing in the hospital.  Your exam/testing today is overall reassuring.  Use the  Naprosyn twice daily as needed for headaches.  Recommend follow-up with the neurologist if your headaches continue.  Please return to the Emergency Department if you experience any worsening of your condition.   Thank you for allowing [place] to be a part of your care.      Sabas Sous, MD 04/11/23 [phone]

## 2023-04-11 NOTE — Discharge Instructions (Signed)
You were evaluated in the Emergency Department and after careful evaluation, we did not find any emergent condition requiring admission or further testing in the hospital.  Your exam/testing today is overall reassuring.  Use the Naprosyn twice daily as needed for headaches.  Recommend follow-up with the neurologist if your headaches continue.  Please return to the Emergency Department if you experience any worsening of your condition.   Thank you for allowing [place] to be a part of your care.

## 2023-06-03 ENCOUNTER — Telehealth: Payer: Self-pay

## 2023-06-03 NOTE — Telephone Encounter (Signed)
Fax to [facility]

## 2023-07-02 ENCOUNTER — Encounter: Payer: Self-pay | Admitting: Gastroenterology

## 2024-05-09 ENCOUNTER — Encounter (HOSPITAL_COMMUNITY): Payer: Self-pay

## 2024-05-09 ENCOUNTER — Emergency Department (HOSPITAL_COMMUNITY)

## 2024-05-09 ENCOUNTER — Other Ambulatory Visit: Payer: Self-pay

## 2024-05-09 ENCOUNTER — Emergency Department (HOSPITAL_COMMUNITY)
Admission: EM | Admit: 2024-05-09 | Discharge: 2024-05-10 | Disposition: A | Discharge status: Home / Self Care | Payer: Self-pay | Attending: Emergency Medicine | Admitting: Emergency Medicine

## 2024-05-09 DIAGNOSIS — N939 Abnormal uterine and vaginal bleeding, unspecified: Secondary | ICD-10-CM | POA: Insufficient documentation

## 2024-05-09 DIAGNOSIS — R102 Pelvic and perineal pain: Secondary | ICD-10-CM

## 2024-05-09 MED ORDER — OXYCODONE-ACETAMINOPHEN 5-325 MG PO TABS
1.0000 | ORAL_TABLET | Freq: Once | ORAL | Status: AC
  Administered 2024-05-10: 1 via ORAL
  Filled 2024-05-09: qty 1

## 2024-05-09 NOTE — ED Provider Notes (Signed)
  EMERGENCY DEPARTMENT AT [facility] Provider Note   CSN: [id number] Arrival date & time: 05/09/24  2152     Patient presents with: Vaginal Bleeding   [name] is a 43 y.o. female.   Patient to ED with heavy vaginal bleeding that started yesterday. She reports history IUD in treatment of bleeding with  uterine fibroids for the past 'years' that was removed one week ago. She was having some right sided pelvic pain and went to her GYN. An ultrasound was performed on that evaluation and, per patient, was concerning for fluid in the right fallopian tube. Her IUD was removed. She states that pain persists and is worse than when seen one week ago. No fever, nausea, vomiting. No urinary symptoms. No lightheadedness or dizziness.   The history is provided by the patient. No language interpreter was used.  Vaginal Bleeding      Prior to Admission medications   Medication Sig Start Date End Date Taking? Authorizing Provider  acetaminophen  (TYLENOL ) 500 MG tablet Take 500 mg by mouth every 6 (six) hours as needed for mild pain.    [provider]  ibuprofen  (ADVIL ,MOTRIN ) 600 MG tablet Take 1 tablet (600 mg total) by mouth every 6 (six) hours as needed for mild pain or cramping. 09/16/15   Eldonna Suzen Octave, MD  methylPREDNISolone  (MEDROL  DOSEPAK) 4 MG TBPK tablet Day 1: 8mg  before breakfast, 4 mg after lunch, 4 mg after supper, and 8 mg at bedtime Day 2: 4 mg before breakfast, 4 mg after lunch, 4 mg  after supper, and 8 mg  at bedtime Day 3:  4 mg  before breakfast, 4 mg  after lunch, 4 mg after supper, and 4 mg  at bedtime Day 4: 4 mg  before breakfast, 4 mg  after lunch, and 4 mg at bedtime Day 5: 4 mg  before breakfast and 4 mg at bedtime Day 6: 4 mg  before breakfast 03/04/23   Floyd, Dan, DO  naproxen  (NAPROSYN ) 500 MG tablet Take 1 tablet (500 mg total) by mouth 2 (two) times daily. 04/11/23   Theadore Ozell HERO, MD    Allergies: Patient has no known  allergies.    Review of Systems  Genitourinary:  Positive for vaginal bleeding.    Updated Vital Signs BP (!) 127/92 (BP Location: Right Arm)   Pulse 90   Temp 98 F (36.7 C) (Oral)   Resp 15   Ht 5' 3 (1.6 m)   Wt 68 kg   SpO2 99%   BMI 26.57 kg/m   Physical Exam Vitals and nursing note reviewed.  Constitutional:      Appearance: Normal appearance.  Eyes:     Comments: No conjunctival pallor.   Pulmonary:     Effort: Pulmonary effort is normal.  Abdominal:     General: There is no distension.     Palpations: Abdomen is soft.     Tenderness: There is abdominal tenderness (Suprapublic and R>L adnexal tenderness.).  Skin:    General: Skin is warm and dry.  Neurological:     Mental Status: She is alert and oriented to person, place, and time.     (all labs ordered are listed, but only abnormal results are displayed) Labs Reviewed  CBC WITH DIFFERENTIAL/PLATELET  BASIC METABOLIC PANEL WITH GFR  HCG, SERUM, QUALITATIVE    EKG: None  Radiology: No results found.   Procedures   Medications Ordered in the ED  oxyCODONE -acetaminophen  (PERCOCET/ROXICET) 5-325 MG per tablet  1 tablet (has no administration in time range)    Clinical Course as of 05/09/24 2344  Sun May 09, 2024  2340 Patient with h/o fibroids, longterm IUD which was removed one week ago due to abnormal US  with GYN. Now with heavy vaginal bleeding and worsening right pelvic pain. No fever. No vaginal d/ch or concern for pelvic infection.   Will check hgb, however, VSS with no signs of hemodynamic instability. Pelvic US  ordered to ensure no worsening condition in pelvis, including torsion, TOA, salpingitis. She already has GYN follow up scheduled in 3 days.   Patient care signed out to oncoming provider for review of pending tests and appropriate disposition. Patient's pain addressed with single dose percocet. [SU]    Clinical Course User Index [SU] Odell Balls, PA-C                                  Medical Decision Making Amount and/or Complexity of Data Reviewed Radiology: ordered.  Risk Prescription drug management.        Final diagnoses:  Abnormal uterine bleeding  Pelvic pain    ED Discharge Orders     None          Odell Balls RIGGERS 05/09/24 2344    Carita Senior, MD 05/10/24 [phone]

## 2024-05-09 NOTE — ED Notes (Signed)
 Sent urine down to the lab. Pt is on her cycle.  Pelvic cart to beside.

## 2024-05-09 NOTE — ED Triage Notes (Signed)
 Pt started menstruating yesterday. C/o heavy vaginal bleeding and heavier cramping than usual. Passing golf ball sized clots and filling up ultra tampon every 45 minutes.  Had IUD removed a month ago, has uterine fibroids.

## 2024-05-10 LAB — BASIC METABOLIC PANEL WITH GFR
Anion gap: 12 (ref 5–15)
BUN: 12 mg/dL (ref 6–20)
CO2: 21 mmol/L — ABNORMAL LOW (ref 22–32)
Calcium: 8.9 mg/dL (ref 8.9–10.3)
Chloride: 106 mmol/L (ref 98–111)
Creatinine, Ser: 0.69 mg/dL (ref 0.44–1.00)
GFR, Estimated: >60 mL/min (ref >60)
Glucose, Bld: 87 mg/dL (ref 70–99)
Potassium: 3.5 mmol/L (ref 3.5–5.1)
Sodium: 138 mmol/L (ref 135–145)

## 2024-05-10 LAB — CBC WITH DIFFERENTIAL/PLATELET
Abs Immature Granulocytes: 0.01 K/uL (ref 0.00–0.07)
Basophils Absolute: 0 K/uL (ref 0.0–0.1)
Basophils Relative: 1 %
Eosinophils Absolute: 0.1 K/uL (ref 0.0–0.5)
Eosinophils Relative: 2 %
HCT: 34.4 % — ABNORMAL LOW (ref 36.0–46.0)
Hemoglobin: 11 g/dL — ABNORMAL LOW (ref 12.0–15.0)
Immature Granulocytes: 0 %
Lymphocytes Relative: 41 %
Lymphs Abs: 2 K/uL (ref 0.7–4.0)
MCH: 28.8 pg (ref 26.0–34.0)
MCHC: 32 g/dL (ref 30.0–36.0)
MCV: 90.1 fL (ref 80.0–100.0)
Monocytes Absolute: 0.3 K/uL (ref 0.1–1.0)
Monocytes Relative: 7 %
Neutro Abs: 2.4 K/uL (ref 1.7–7.7)
Neutrophils Relative %: 49 %
Platelets: 233 K/uL (ref 150–400)
RBC: 3.82 MIL/uL — ABNORMAL LOW (ref 3.87–5.11)
RDW: 14.2 % (ref 11.5–15.5)
WBC: 4.9 K/uL (ref 4.0–10.5)
nRBC: 0 % (ref 0.0–0.2)

## 2024-05-10 LAB — HCG, SERUM, QUALITATIVE: Preg, Serum: NEGATIVE

## 2024-05-10 NOTE — Discharge Instructions (Signed)
 Your workup today was reassuring.  You do not have any ovarian torsion, and your hemoglobin is at its baseline, 11 today.  Please follow-up with your OB/GYN as scheduled for Wednesday and return to ER with any severe symptoms.

## 2024-05-10 NOTE — ED Provider Notes (Signed)
  Physical Exam  BP (!) 127/92 (BP Location: Right Arm)   Pulse 90   Temp 98 F (36.7 C) (Oral)   Resp 15   Ht 5' 3 (1.6 m)   Wt 68 kg   SpO2 99%   BMI 26.57 kg/m   Physical Exam  Procedures  Procedures  ED Course / MDM   Clinical Course as of 05/10/24 0229  Sun May 09, 2024  2340 Patient with h/o fibroids, longterm IUD which was removed one week ago due to abnormal US  with GYN. Now with heavy vaginal bleeding and worsening right pelvic pain. No fever. No vaginal d/ch or concern for pelvic infection.   Will check hgb, however, VSS with no signs of hemodynamic instability. Pelvic US  ordered to ensure no worsening condition in pelvis, including torsion, TOA, salpingitis. She already has GYN follow up scheduled in 3 days.   Patient care signed out to oncoming provider for review of pending tests and appropriate disposition. Patient's pain addressed with single dose percocet. [SU]    Clinical Course User Index [SU] Odell Balls, PA-C   Medical Decision Making Amount and/or Complexity of Data Reviewed Labs: ordered. Radiology: ordered.  Risk Prescription drug management.   Care of this patient assumed from preceding ED provider Balls, PA-C at time of shift change.  Please see her associated note for further insight to the patient ED course.  In brief, patient is a 56 With history of fibroids and heavy vaginal bleeding who recently had her IUD removed and presents today with heavy vaginal bleeding.  She also has worsening right lower quadrant pain over the last month, had a reassuring outpatient ultrasound.  At time of shift change she is pending labs to confirm hemoglobin, pending torsion rule out ultrasound. likely d/c home with diagnosis of AUB secondary to fibroids.   CBC with anemia with hemoglobin 11 at patient's baseline.  BMP unremarkable, pelvic ultrasound with uterine fibroids, no ovarian torsion.  Clinical picture most consistent with abnormal uterine bleeding.  At  this time patient has close outpatient follow-up with OB/GYN in 3 days; clinical concern for emergent underlying condition of her symptoms that would warrant further ED workup or inpatient management is exceedingly low.  Lear  voiced understanding of her medical evaluation and treatment plan. Each of their questions answered to their expressed satisfaction.  Return precautions were given.  Patient is well-appearing, stable, and was discharged in good condition.  This chart was dictated using voice recognition software, Dragon. Despite the best efforts of this provider to proofread and correct errors, errors may still occur which can change documentation meaning.     Bobette Pleasant JONELLE DEVONNA 05/10/24 0229    Carita Senior, MD 05/10/24 [phone]
